# Patient Record
Sex: Female | Born: 2000 | Race: Black or African American | Hispanic: No | Marital: Single | Smoking: Never smoker
Health system: Southern US, Community
[De-identification: ages and names within clinical notes are randomized; demographics above are authoritative.]

## PROBLEM LIST (undated history)

## (undated) DIAGNOSIS — F32A Depression, unspecified: Secondary | ICD-10-CM

## (undated) DIAGNOSIS — R112 Nausea with vomiting, unspecified: Secondary | ICD-10-CM

## (undated) DIAGNOSIS — J45909 Unspecified asthma, uncomplicated: Secondary | ICD-10-CM

## (undated) DIAGNOSIS — T8859XA Other complications of anesthesia, initial encounter: Secondary | ICD-10-CM

## (undated) DIAGNOSIS — F419 Anxiety disorder, unspecified: Secondary | ICD-10-CM

## (undated) DIAGNOSIS — D649 Anemia, unspecified: Secondary | ICD-10-CM

## (undated) DIAGNOSIS — F411 Generalized anxiety disorder: Secondary | ICD-10-CM

## (undated) DIAGNOSIS — F909 Attention-deficit hyperactivity disorder, unspecified type: Secondary | ICD-10-CM

## (undated) DIAGNOSIS — G43909 Migraine, unspecified, not intractable, without status migrainosus: Secondary | ICD-10-CM

## (undated) DIAGNOSIS — Z9889 Other specified postprocedural states: Secondary | ICD-10-CM

## (undated) DIAGNOSIS — F329 Major depressive disorder, single episode, unspecified: Secondary | ICD-10-CM

## (undated) HISTORY — PX: WISDOM TOOTH EXTRACTION: SHX21

## (undated) HISTORY — PX: NO PAST SURGERIES: SHX2092

## (undated) HISTORY — DX: Anemia, unspecified: D64.9

## (undated) HISTORY — PX: TONSILLECTOMY: SUR1361

---
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
## 2022-01-03 DIAGNOSIS — Z3493 Encounter for supervision of normal pregnancy, unspecified, third trimester: Secondary | ICD-10-CM | POA: Diagnosis not present

## 2022-01-03 DIAGNOSIS — Z419 Encounter for procedure for purposes other than remedying health state, unspecified: Secondary | ICD-10-CM | POA: Diagnosis not present

## 2022-01-03 DIAGNOSIS — Z331 Pregnant state, incidental: Secondary | ICD-10-CM | POA: Diagnosis not present

## 2022-01-03 DIAGNOSIS — Z363 Encounter for antenatal screening for malformations: Secondary | ICD-10-CM | POA: Diagnosis not present

## 2022-01-03 DIAGNOSIS — Z3A37 37 weeks gestation of pregnancy: Secondary | ICD-10-CM | POA: Diagnosis not present

## 2022-01-03 DIAGNOSIS — Z3A30 30 weeks gestation of pregnancy: Secondary | ICD-10-CM | POA: Diagnosis not present

## 2022-01-03 DIAGNOSIS — Z3686 Encounter for antenatal screening for cervical length: Secondary | ICD-10-CM | POA: Diagnosis not present

## 2022-01-08 IMAGING — CT CT ANGIO HEAD
2 of 12 series · 6 of 33 positions shown · IV contrast (omnipaque)
Comparison: None.

CLINICAL DATA: Severe headache, left arm weakness and numbness

EXAM:
CT ANGIOGRAPHY HEAD AND NECK
TECHNIQUE: Multidetector CT imaging of the head and neck was performed using
the standard protocol during bolus administration of intravenous
contrast. Multiplanar CT image reconstructions and MIPs were
obtained to evaluate the vascular anatomy. Carotid stenosis
measurements (when applicable) are obtained utilizing NASCET
criteria, using the distal internal carotid diameter as the
denominator.
CONTRAST:  100mL OMNIPAQUE IOHEXOL 350 MG/ML SOLN

[Series 9: cta head & neck · axial · 0.60mm/px · z∈[-584,-387]mm · 4 of 662 slices shown]
[im 133/662  soft-tissue]
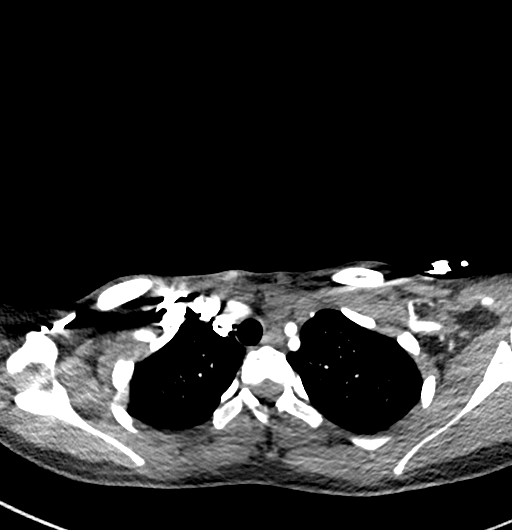
[im 265/662  bone]
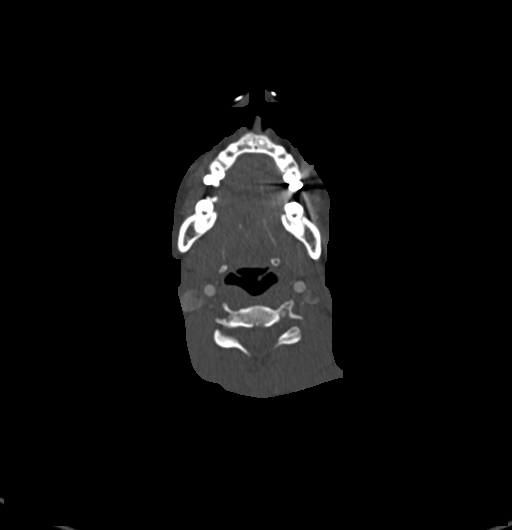
[im 397/662  soft-tissue]
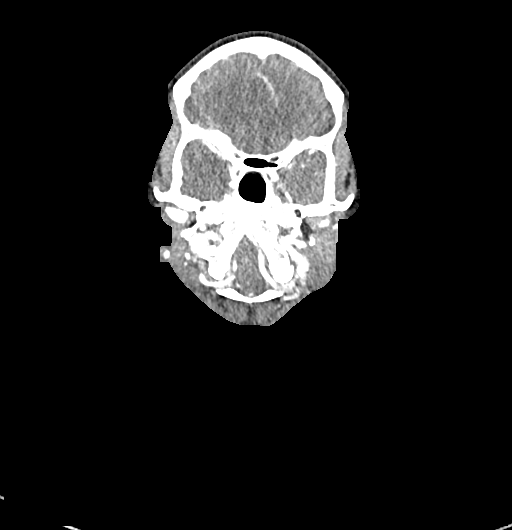
[im 529/662  bone]
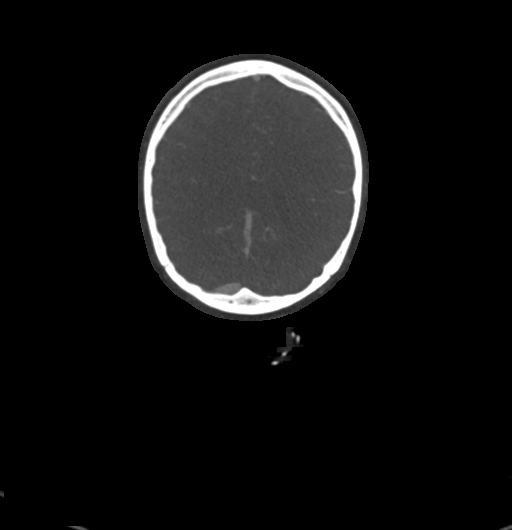

[Series 10: ax thin · axial · 0.60mm/px · z∈[-540,-430]mm · 2 of 331 slices shown]
[im 111/331  soft-tissue]
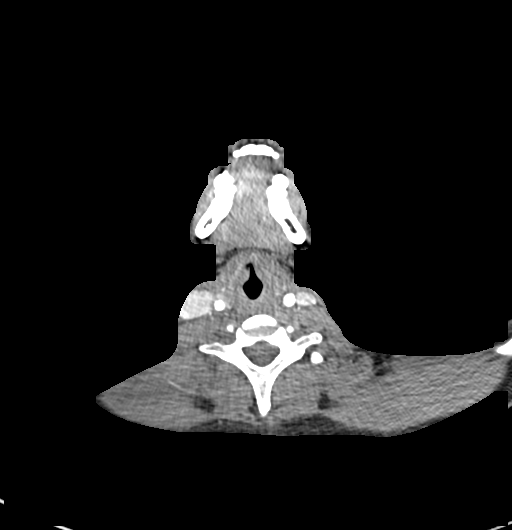
[im 221/331  soft-tissue]
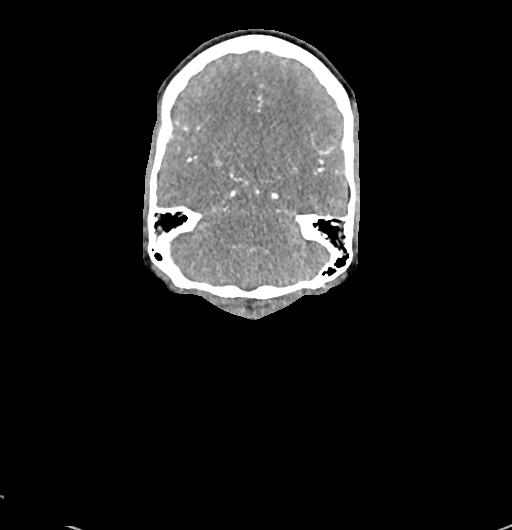

[6 of 33 positions shown; findings below may reference images not displayed]

FINDINGS: CT HEAD

Brain: There is no acute intracranial hemorrhage, mass effect, or
edema. Gray-white differentiation is preserved. There is no
extra-axial fluid collection. Ventricles and sulci are within normal
limits in size and configuration.

Vascular: No hyperdense vessel.  Better evaluated on CTA portion.

Skull: Calvarium is unremarkable.

Sinuses/Orbits: No acute finding.

Other: None.

Review of the MIP images confirms the above findings

CTA NECK

Aortic arch: Great vessel origins are patent.

Right carotid system: Patent. No stenosis, evidence of dissection,
or aneurysmal dilatation.

Left carotid system: Patent. No stenosis, evidence of dissection, or
aneurysmal dilatation.

Vertebral arteries: Patent. Left vertebral artery is slightly
dominant. No stenosis, evidence of dissection, or aneurysmal
dilatation.

Skeleton: No significant osseous abnormality.

Other neck: Unremarkable.

Upper chest: Included upper lungs are clear.

Review of the MIP images confirms the above findings

CTA HEAD

Anterior circulation: Intracranial internal carotid arteries are
patent. Anterior and middle cerebral arteries are patent.

Posterior circulation: Intracranial vertebral arteries, basilar
artery, and posterior cerebral arteries are patent. Bilateral
posterior communicating arteries are present.

Venous sinuses: Patent as allowed by contrast bolus timing.

Review of the MIP images confirms the above findings
IMPRESSION: No acute intracranial abnormality.

Unremarkable vascular imaging.

## 2022-01-15 ENCOUNTER — Ambulatory Visit (INDEPENDENT_AMBULATORY_CARE_PROVIDER_SITE_OTHER): Payer: Medicaid Other | Admitting: Clinical

## 2022-01-15 ENCOUNTER — Other Ambulatory Visit: Payer: Self-pay

## 2022-01-15 ENCOUNTER — Encounter (HOSPITAL_COMMUNITY): Payer: Self-pay | Admitting: Psychiatry

## 2022-01-15 DIAGNOSIS — F411 Generalized anxiety disorder: Secondary | ICD-10-CM

## 2022-01-15 DIAGNOSIS — F410 Panic disorder [episodic paroxysmal anxiety] without agoraphobia: Secondary | ICD-10-CM | POA: Diagnosis not present

## 2022-01-15 NOTE — Progress Notes (Unsigned)
° °  THERAPIST PROGRESS NOTE  Session Time: ***  Participation Level: {BHH PARTICIPATION LEVEL:22264}  Behavioral Response: {Appearance:22683}{BHH LEVEL OF CONSCIOUSNESS:22305}{BHH MOOD:22306}  Type of Therapy: {CHL AMB BH Type of Therapy:21022741}  Treatment Goals addressed: ***  ProgressTowards Goals: {Progress Towards Goals:21014066}  Interventions: {CHL AMB BH Type of Intervention:21022753}  Summary:  Brandi Church is a 21 y.o. female who presents for the scheduled session oriented times five, appropriately dressed, and friendly. Client denied hallucinations and delusions. Client reported on today she is doing good. Client reported since she was last seen school has been going well and her roommate situation has somewhat improved. Client reported she will be moving back home in three weeks. Client reported once she has her baby in  May she will finish school remotely. Client reported her checks ups with her OBGYN are good and the baby is doing well. Client reported her baby shower is being planned but feeling slightly disappointed about her mothers lack of communication. Client reported her anxiety and depression have caused very little interference but her main concern is after delivery. Client reported she thinks about her boundaries once the baby is here. Client reported she will have an attitude and people take it the wrong way when in reality she has learned if she does not speak up for herself she becomes uncomfortable. Client reported she is happy to have the support but wants to have room to figure things out on her own instead of others hovering over her. Client reported she has also thought about her mother as she becomes a parent. Client reported she does not want to treat her daughter how her mother did her. Client reported her mother left her to be raised by her grandparents at a young age. Evidence of progress towards goal:  Client reported she is able to cope with her day to day  activity using coping skills 5 out 7 days per week.   Suicidal/Homicidal: Nowithout intent/plan  Therapist Response:  Therapist began the appointment asking the client how she has been doing since last seen. Therapist used CBT to use active listening and positive emotional support. Therapist used CBT to ask the client if her coping skills interfere with her day to day functioning. Therapist used CBT to ask the client about childhood experiences that affect her current thought processing. Therapist used CBT ask the client to identify her progress with frequency of use with coping skills with continued practice in her daily activity.         Plan: Return again in 5 weeks.  Diagnosis: No diagnosis found.  Collaboration of Care: {BH OP Collaboration of Care:21014065}  Patient/Guardian was advised Release of Information must be obtained prior to any record release in order to collaborate their care with an outside provider. Patient/Guardian was advised if they have not already done so to contact the registration department to sign all necessary forms in order for [place] to release information regarding their care.   Consent: Patient/Guardian gives verbal consent for treatment and assignment of benefits for services provided during this visit. Patient/Guardian expressed understanding and agreed to proceed.   Brandi Church Brandi Hoiland, LCSW 01/15/2022

## 2022-01-18 ENCOUNTER — Telehealth (INDEPENDENT_AMBULATORY_CARE_PROVIDER_SITE_OTHER): Payer: Medicaid Other | Admitting: Psychiatry

## 2022-01-18 DIAGNOSIS — F3341 Major depressive disorder, recurrent, in partial remission: Secondary | ICD-10-CM | POA: Diagnosis not present

## 2022-01-18 DIAGNOSIS — F411 Generalized anxiety disorder: Secondary | ICD-10-CM

## 2022-01-18 DIAGNOSIS — F410 Panic disorder [episodic paroxysmal anxiety] without agoraphobia: Secondary | ICD-10-CM | POA: Diagnosis not present

## 2022-01-18 MED ORDER — SERTRALINE HCL 25 MG PO TABS: 25.0000 mg | ORAL_TABLET | Freq: Every day | ORAL | 1 refills | Status: DC

## 2022-01-18 NOTE — Progress Notes (Signed)
BH MD/PA/NP OP Progress Note ? ?01/18/2022 11:30 AM ?Enriqueta Shutter  ?MRN:  [id number] ? ?Virtual Visit via Video Note ? ?I connected with Hiromi Krone on 01/18/22 at 11:00 AM EDT by a video enabled telemedicine application and verified that I am speaking with the correct person using two identifiers. ? ?Location: ?Patient: home ?Provider: offsite ?  ?I discussed the limitations of evaluation and management by telemedicine and the availability of in person appointments. The patient expressed understanding and agreed to proceed. ?  ?I discussed the assessment and treatment plan with the patient. The patient was provided an opportunity to ask questions and all were answered. The patient agreed with the plan and demonstrated an understanding of the instructions. ?  ?The patient was advised to call back or seek an in-person evaluation if the symptoms worsen or if the condition fails to improve as anticipated. ? ?I provided 10 minutes of non-face-to-face time during this encounter. ? ? ?Mcneil Sober, NP  ? ?Chief Complaint: Medication management ? ?HPI: Yesennia Hirota is a 21 year old female presenting to [facility] behavioral health outpatient for follow-up psychiatric evaluation.  She has a history of generalized anxiety disorder with panic attacks, major depressive disorder, and ADHD.  Her symptoms were previously managed with Zoloft 25 mg daily and hydroxyzine 10 mg, prior to her pregnancy.  Patient reports that she is [redacted] weeks gestation at this time and prefers to restart Zoloft 25 mg daily.  Medication risk versus benefits discussed.  Patient reports that her panic attacks have worsened and are unprovoked.  She reports participating in individual therapy for symptom management which is helpful, but her symptoms are intolerable. ?Patient is alert and oriented x4, calm, pleasant and willing to engage.  She denies suicidal or homicidal ideations, paranoia, delusional thought, auditory or visual hallucinations. ? ?Visit  Diagnosis:  ?  ICD-10-CM   ?1. MDD (major depressive disorder), recurrent, in partial remission (HCC)  F33.41   ?  ?2. Generalized anxiety disorder with panic attacks  F41.1   ? F41.0   ?  ? ? ?Past Psychiatric History: ADHD, major depressive disorder, generalized anxiety disorder with panic attacks ? ?Past Medical History:  ?Past Medical History:  ?Diagnosis Date  ? Anxiety   ? Asthma   ? Contusion of right little finger without damage to nail 01/28/2017  ? Depression   ? Injury of finger of right hand 01/28/2017  ? Migraines   ?  ?Past Surgical History:  ?Procedure Laterality Date  ? NO PAST SURGERIES    ? ? ?Family Psychiatric History: None known ? ?Family History:  ?Family History  ?Problem Relation Age of Onset  ? Healthy Mother   ? Healthy Father   ? Hypertension Maternal Grandmother   ? Hypertension Maternal Grandfather   ? Diabetes Maternal Grandfather   ? ? ?Social History:  ?Social History  ? ?Socioeconomic History  ? Marital status: Single  ?  Spouse name: Not on file  ? Number of children: Not on file  ? Years of education: Not on file  ? Highest education level: Not on file  ?Occupational History  ? Not on file  ?Tobacco Use  ? Smoking status: Never  ?  Passive exposure: Never  ? Smokeless tobacco: Never  ?Vaping Use  ? Vaping Use: Never used  ?Substance and Sexual Activity  ? Alcohol use: Never  ? Drug use: Never  ? Sexual activity: Yes  ?Other Topics Concern  ? Not on file  ?Social History Narrative  ?  Not on file  ? ?Social Determinants of Health  ? ?Financial Resource Strain: Not on file  ?Food Insecurity: Not on file  ?Transportation Needs: Not on file  ?Physical Activity: Not on file  ?Stress: Not on file  ?Social Connections: Not on file  ? ? ?Allergies:  ?Allergies  ?Allergen Reactions  ? Latex   ?  itching  ? ? ?Metabolic Disorder Labs: ?No results found for: HGBA1C, MPG ?No results found for: PROLACTIN ?No results found for: CHOL, TRIG, HDL, CHOLHDL, VLDL, LDLCALC ?No results found for:  TSH ? ?Therapeutic Level Labs: ?No results found for: LITHIUM ?No results found for: VALPROATE ?No components found for:  CBMZ ? ?Current Medications: ?Current Outpatient Medications  ?Medication Sig Dispense Refill  ? sertraline (ZOLOFT) 25 MG tablet Take 1 tablet (25 mg total) by mouth daily. 30 tablet 1  ? albuterol (VENTOLIN HFA) 108 (90 Base) MCG/ACT inhaler Inhale 1-2 puffs into the lungs every 6 (six) hours as needed for wheezing or shortness of breath. (Patient not taking: Reported on 06/29/2021)    ? DICLEGIS 10-10 MG TBEC Take 2 tablets at bedtime and one in the morning and one in the afternoon as needed for nausea. (Patient not taking: Reported on 11/20/2021) 60 tablet 2  ? hydrOXYzine (ATARAX/VISTARIL) 10 MG tablet TAKE 1 TABLET BY MOUTH TWICE A DAY AS NEEDED FOR ANXIETY (Patient not taking: Reported on 09/12/2021) 60 tablet 3  ? ondansetron (ZOFRAN ODT) 4 MG disintegrating tablet Take 1-2 tablets (4-8 mg total) by mouth every 6 (six) hours as needed for nausea or vomiting. (Patient not taking: Reported on 11/20/2021) 30 tablet 1  ? Prenat-Fe Poly-Methfol-FA-DHA (VITAFOL ULTRA) 29-0.6-0.4-200 MG CAPS Take 1 capsule by mouth daily before breakfast. 90 capsule 4  ? pseudoephedrine (SUDAFED) 60 MG tablet Take 1 tablet (60 mg total) by mouth once as needed for congestion. 30 tablet 0  ? ?No current facility-administered medications for this visit.  ? ? ? ?Musculoskeletal: ?Strength & Muscle Tone: N/A virtual visit ?Gait & Station: N/A ?Patient leans: N/A ? ?Psychiatric Specialty Exam: ?Review of Systems  ?Psychiatric/Behavioral:  Positive for dysphoric mood. Negative for hallucinations, self-injury and suicidal ideas. The patient is nervous/anxious.   ?All other systems reviewed and are negative.  ?Last menstrual period 06/07/2021.There is no height or weight on file to calculate BMI.  ?General Appearance: Well Groomed  ?Eye Contact:  Good  ?Speech:  Clear and Coherent  ?Volume:  Normal  ?Mood:  Depressed   ?Affect:  Congruent  ?Thought Process:  Goal Directed  ?Orientation:  Full (Time, Place, and Person)  ?Thought Content: Logical   ?Suicidal Thoughts:  No  ?Homicidal Thoughts:  No  ?Memory: Good  ?Judgement: Good  ?Insight: Good  ?Psychomotor Activity: N/A  ?Concentration: Good  ?Recall: Good  ?Fund of Knowledge: Good  ?Language: Good  ?Akathisia: N/A  ?Handed: Right  ?AIMS (if indicated): Not done  ?Assets:  Communication Skills ?Desire for Improvement  ?ADL's:  Intact  ?Cognition: WNL  ?Sleep:  Good  ? ?Screenings: ?GAD-7   ? ?Flowsheet Row [facility]  ?Total GAD-7 Score 0 11 8 11 17   ? ?  ? ?PHQ2-9   ? ?Flowsheet Row Routine Prenatal from 12/20/2021 in [facility] Initial Prenatal from 08/17/2021 in [facility] Video Visit from 06[facility]30/2021 in [facility] Office Visit from 01/19/2021 in [facility] ED from 12/06/2020 in [facility]  ?PHQ-2 Total Score 0 0 0 4 5  ?PHQ-9 Total Score 2 1 4 13 16   ? ?  ? ?Flowsheet Row Admission (Discharged) from 12/17/2021 in [facility] IV Medication 420 from 11/30/2021 in [facility] from 07/24/2021 in [facility]  ?C-SSRS RISK CATEGORY No Risk No Risk No Risk  ? ?  ? ? ? ?Assessment and Plan: Vanessia Bokhari is a 21 year old female presenting to [facility] behavioral health outpatient for follow-up psychiatric evaluation.  She has a history of generalized anxiety disorder with panic attacks, major depressive disorder, and ADHD.   Her symptoms were previously managed with Zoloft 25 mg daily and hydroxyzine 10 mg, prior to her pregnancy.  Patient reports that she is [redacted] weeks gestation at this time and prefers to restart Zoloft 25 mg daily.

## 2022-01-19 ENCOUNTER — Ambulatory Visit: Payer: Medicaid Other

## 2022-01-19 ENCOUNTER — Ambulatory Visit: Payer: Medicaid Other | Attending: Obstetrics and Gynecology

## 2022-01-30 ENCOUNTER — Ambulatory Visit (INDEPENDENT_AMBULATORY_CARE_PROVIDER_SITE_OTHER): Payer: Medicaid Other | Admitting: Clinical

## 2022-01-30 ENCOUNTER — Other Ambulatory Visit: Payer: Self-pay

## 2022-01-30 DIAGNOSIS — F411 Generalized anxiety disorder: Secondary | ICD-10-CM | POA: Diagnosis not present

## 2022-01-30 DIAGNOSIS — F410 Panic disorder [episodic paroxysmal anxiety] without agoraphobia: Secondary | ICD-10-CM

## 2022-01-31 ENCOUNTER — Encounter (HOSPITAL_COMMUNITY): Payer: Self-pay

## 2022-01-31 NOTE — Plan of Care (Signed)
Client was in agreement with the plan. °

## 2022-01-31 NOTE — Progress Notes (Signed)
?  THERAPIST PROGRESS NOTE ? ? ?Session Time: 30 minutes ? ?Participation Level: Active ? ?Behavioral Response: CasualAlertEuthymic ? ?Type of Therapy: Individual Therapy ? ?Treatment Goals addressed: patient will practice problem solving skills 3 times per week for the next 4 weeks ? ?ProgressTowards Goals: Progressing ? ?Interventions: CBT and Supportive ? ?Summary:  ?Brandi Church is a 21 y.o. female who presents for the scheduled session oriented x5, appropriately dressed, and friendly.  Client denied hallucinations and delusions. ?Client reported on today that she is doing pretty well.  Client reported she has been working on finishing up her school semester and continuing to make preparations for her baby.  Client reported she will be moving back home in a few weeks.  Client reported she got her midterm grades back and they were good.  Client reported the family is now planning her baby shower.  Client reported her depressive symptoms have been pretty fair but she has had persistent anxiety symptoms.  Client reported she is constantly thinking about worst-case scenario, on edge about preparing for the baby, and making sure that she remembers to get all her assignments done for school.  Client reported being on campus she feels sad sometimes about close that reveal her stomach which is showing more. Client reported she thinks people will look at her and judge her for being a mom on campus. Client reported otherwise her family and social support is really good.  ?Evidence of progress towards goal:  client reported she has been staying organized to help control things she can by staying organized using a planner 7 out of 7 days per week.  ? ? ?Suicidal/Homicidal: Nowithout intent/plan ? ?Therapist Response:  ?Therapist began the appointment asking the client how she has been doing since last seen. ?Therapist used CBT to engage using active listening and positive emotional support. ?Therapist used CBT to ask the  client about the severity of her anxiety symptoms and contributing external factors. ?Therapist used CBT to engage and normalize the clients emotional reaction and how to challenge negative thoughts. 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 ?Therapist used CBT ask the client to identify her progress with frequency of use with coping skills with continued practice in her daily activity.    ?Therapist assigned the client homework for her to read the psychoeducational worksheet provided to her about tips on managing stress and depression. ?Client was scheduled for next appointment. ? ? ? ?Plan: Return again in 5 weeks. ? ?Diagnosis: generalized anxiety disorder ? ?Collaboration of Care: Patient refused AEB none requested at this time. ? ?Patient/Guardian was advised Release of Information must be obtained prior to any record release in order to collaborate their care with an outside provider. Patient/Guardian was advised if they have not already done so to contact the registration department to sign all necessary forms in order for [place] to release information regarding their care.  ? ?Consent: Patient/Guardian gives verbal consent for treatment and assignment of benefits for services provided during this visit. Patient/Guardian expressed understanding and agreed to proceed.  ? ?Brandi Rhymes Joyceline Maiorino, LCSW ?01/31/2022 ? ?

## 2022-02-03 DIAGNOSIS — Z419 Encounter for procedure for purposes other than remedying health state, unspecified: Secondary | ICD-10-CM | POA: Diagnosis not present

## 2022-02-13 ENCOUNTER — Ambulatory Visit (HOSPITAL_COMMUNITY): Payer: Medicaid Other | Admitting: Clinical

## 2022-02-15 DIAGNOSIS — O09619 Supervision of young primigravida, unspecified trimester: Secondary | ICD-10-CM | POA: Diagnosis not present

## 2022-02-15 DIAGNOSIS — Z113 Encounter for screening for infections with a predominantly sexual mode of transmission: Secondary | ICD-10-CM | POA: Diagnosis not present

## 2022-02-15 DIAGNOSIS — Z369 Encounter for antenatal screening, unspecified: Secondary | ICD-10-CM | POA: Diagnosis not present

## 2022-02-15 DIAGNOSIS — D649 Anemia, unspecified: Secondary | ICD-10-CM | POA: Diagnosis not present

## 2022-02-15 DIAGNOSIS — J45909 Unspecified asthma, uncomplicated: Secondary | ICD-10-CM | POA: Diagnosis not present

## 2022-02-15 LAB — OB RESULTS CONSOLE GBS: GBS: NEGATIVE

## 2022-02-26 ENCOUNTER — Encounter (HOSPITAL_COMMUNITY): Payer: Self-pay | Admitting: Obstetrics & Gynecology

## 2022-02-26 ENCOUNTER — Other Ambulatory Visit: Payer: Self-pay

## 2022-02-26 ENCOUNTER — Inpatient Hospital Stay (HOSPITAL_COMMUNITY)
Admission: AD | Admit: 2022-02-26 | Discharge: 2022-02-26 | Disposition: A | Discharge status: Home / Self Care | Payer: Medicaid Other | Attending: Obstetrics & Gynecology | Admitting: Obstetrics & Gynecology

## 2022-02-26 DIAGNOSIS — Z3A37 37 weeks gestation of pregnancy: Secondary | ICD-10-CM

## 2022-02-26 DIAGNOSIS — O368130 Decreased fetal movements, third trimester, not applicable or unspecified: Secondary | ICD-10-CM

## 2022-02-26 DIAGNOSIS — O479 False labor, unspecified: Secondary | ICD-10-CM | POA: Diagnosis not present

## 2022-02-26 DIAGNOSIS — O471 False labor at or after 37 completed weeks of gestation: Secondary | ICD-10-CM | POA: Diagnosis not present

## 2022-02-26 DIAGNOSIS — Z3689 Encounter for other specified antenatal screening: Secondary | ICD-10-CM

## 2022-02-26 NOTE — MAU Note (Signed)
.  Brandi Church is a 21 y.o. at [redacted]w[redacted]d here in MAU reporting: contractions that started 2-3 hours ago and have progressed in intensity and decreased fetal movement over the last 1.5 hours. Denies SROM, vaginal bleeding or bloody show. ?Pain score: 6 lower abdomen ? ?Vitals:  ? 02/26/22 0602  ?BP: 119/73  ?Resp: 17  ?Temp: 98.4 ?F (36.9 ?C)  ?SpO2: 100%  ?   ?FHT:157bpm ? ?  ?

## 2022-02-26 NOTE — MAU Provider Note (Signed)
?History  ?  ? ?[id number] ? ?Arrival date and time: 02/26/22 0535 ?  ? ?Chief Complaint  ?Patient presents with  ? Contractions  ? Decreased Fetal Movement  ? ? ? ?HPI ?Brandi Church is a 21 y.o. at [redacted]w[redacted]d by LMP who presents for contractions & decreased fetal movement. ?Reports contractions started early this morning. States painful contractions that come about every 15 minutes. Denies vomiting, vaginal bleeding, or leaking of fluid.  ?Has only felt baby 2-3 times in the last 1.5 hours.   ? ?OB History   ? ? Gravida  ?1  ? Para  ?0  ? Term  ?0  ? Preterm  ?0  ? AB  ?0  ? Living  ?0  ?  ? ? SAB  ?0  ? IAB  ?0  ? Ectopic  ?0  ? Multiple  ?0  ? Live Births  ?0  ?   ?  ?  ? ? ?Past Medical History:  ?Diagnosis Date  ? Anxiety   ? Asthma   ? Contusion of right little finger without damage to nail 01/28/2017  ? Depression   ? Injury of finger of right hand 01/28/2017  ? Migraines   ? ? ?Past Surgical History:  ?Procedure Laterality Date  ? NO PAST SURGERIES    ? ? ?Family History  ?Problem Relation Age of Onset  ? Healthy Mother   ? Healthy Father   ? Hypertension Maternal Grandmother   ? Hypertension Maternal Grandfather   ? Diabetes Maternal Grandfather   ? ? ?Allergies  ?Allergen Reactions  ? Latex   ?  itching  ? ? ?No current facility-administered medications on file prior to encounter.  ? ?Current Outpatient Medications on File Prior to Encounter  ?Medication Sig Dispense Refill  ? fluticasone (FLOVENT HFA) 110 MCG/ACT inhaler Inhale 2 puffs into the lungs daily.    ? Prenat-Fe Poly-Methfol-FA-DHA (VITAFOL ULTRA) 29-0.6-0.4-200 MG CAPS Take 1 capsule by mouth daily before breakfast. 90 capsule 4  ? albuterol (VENTOLIN HFA) 108 (90 Base) MCG/ACT inhaler Inhale 1-2 puffs into the lungs every 6 (six) hours as needed for wheezing or shortness of breath. (Patient not taking: Reported on 06/29/2021)    ? DICLEGIS 10-10 MG TBEC Take 2 tablets at bedtime and one in the morning and one in the afternoon as needed for nausea.  (Patient not taking: Reported on 11/20/2021) 60 tablet 2  ? hydrOXYzine (ATARAX/VISTARIL) 10 MG tablet TAKE 1 TABLET BY MOUTH TWICE A DAY AS NEEDED FOR ANXIETY (Patient not taking: Reported on 09/12/2021) 60 tablet 3  ? ondansetron (ZOFRAN ODT) 4 MG disintegrating tablet Take 1-2 tablets (4-8 mg total) by mouth every 6 (six) hours as needed for nausea or vomiting. (Patient not taking: Reported on 11/20/2021) 30 tablet 1  ? pseudoephedrine (SUDAFED) 60 MG tablet Take 1 tablet (60 mg total) by mouth once as needed for congestion. 30 tablet 0  ? sertraline (ZOLOFT) 25 MG tablet Take 1 tablet (25 mg total) by mouth daily. 30 tablet 1  ? ? ? ?ROS ?Pertinent positives and negative per HPI, all others reviewed and negative ? ?Physical Exam  ? ?BP 119/74   Pulse (!) 103   Temp 98.4 ?F (36.9 ?C) (Oral)   Resp 17   Ht 5\' 4"  (1.626 m)   Wt 70.8 kg   LMP 06/07/2021   SpO2 99%   BMI 26.78 kg/m?  ? ?Patient Vitals for the past 24 hrs: ? BP Temp Temp src Pulse Resp  SpO2 Height Weight  ?02/26/22 0739 115/66 -- -- 92 -- -- -- --  ?02/26/22 0620 119/74 -- -- (!) 103 17 99 % -- --  ?02/26/22 0615 -- -- -- -- -- 100 % -- --  ?02/26/22 0610 -- -- -- -- -- 100 % -- --  ?02/26/22 0605 -- -- -- -- -- 100 % -- --  ?02/26/22 0603 119/73 -- -- 97 -- -- -- --  ?02/26/22 0602 119/73 98.4 ?F (36.9 ?C) Oral -- 17 100 % 5\' 4"  (1.626 m) 70.8 kg  ? ? ?Physical Exam ?Vitals and nursing note reviewed.  ?Constitutional:   ?   General: She is not in acute distress. ?   Appearance: Normal appearance.  ?HENT:  ?   Head: Normocephalic and atraumatic.  ?Eyes:  ?   Conjunctiva/sclera: Conjunctivae normal.  ?   Pupils: Pupils are equal, round, and reactive to light.  ?Pulmonary:  ?   Effort: Pulmonary effort is normal. No respiratory distress.  ?Neurological:  ?   General: No focal deficit present.  ?   Mental Status: She is alert.  ?Psychiatric:     ?   Mood and Affect: Mood normal.     ?   Behavior: Behavior normal.  ?  ? ?Cervical Exam ?Dilation:  Fingertip ?Effacement (%): 60 ?Cervical Position: Posterior ?Station: -3 ?Presentation: Vertex ?Exam by:: [email or web address] RN ? ?FHT ?Baseline 145, moderate variability, 15x15 accels, no decels ?Toco: irregular, Q2-10 minutes ?Cat: 1 ? ?Labs ?No results found for this or any previous visit (from the past 24 hour(s)). ? ?Imaging ?No results found. ? ?MAU Course  ?Procedures ?Lab Orders  ?No laboratory test(s) ordered today  ? ?No orders of the defined types were placed in this encounter. ? ?Imaging Orders  ?No imaging studies ordered today  ? ? ?MDM ?Cervix unchanged & irregular contractions on monitor ? ?Patient reports decreased movement for 1-2 hours prior to arrival. Just woke up & hasn't had anything to eat or drink. Movement improved in MAU. Reactive fetal tracing. Patient documented 13 movements while being monitored.  ?Assessment and Plan  ? ?1. False labor  ?-cervix unchanged & not having regular contractions  ?2. Decreased fetal movements in third trimester, single or unspecified fetus  ?-Reactive tracing & normal movement in MAU ?-discussed kick counts & reasons to return  ?3. NST (non-stress test) reactive   ?4. [redacted] weeks gestation of pregnancy   ? ? ? ?Ginnie Smart, NP ?02/26/22 ?7:39 AM ? ? ?

## 2022-02-26 NOTE — Progress Notes (Signed)
Attempted to check cervix.  RN unable to reach cervix.  Pt declines having someone else attempt VE. ?

## 2022-02-27 DIAGNOSIS — Z3A37 37 weeks gestation of pregnancy: Secondary | ICD-10-CM | POA: Diagnosis not present

## 2022-02-27 DIAGNOSIS — O43893 Other placental disorders, third trimester: Secondary | ICD-10-CM | POA: Diagnosis not present

## 2022-03-01 ENCOUNTER — Telehealth (HOSPITAL_COMMUNITY): Payer: Medicaid Other | Admitting: Psychiatry

## 2022-03-01 ENCOUNTER — Encounter (HOSPITAL_COMMUNITY): Payer: Self-pay

## 2022-03-05 DIAGNOSIS — Z419 Encounter for procedure for purposes other than remedying health state, unspecified: Secondary | ICD-10-CM | POA: Diagnosis not present

## 2022-03-06 ENCOUNTER — Inpatient Hospital Stay (HOSPITAL_BASED_OUTPATIENT_CLINIC_OR_DEPARTMENT_OTHER): Payer: Medicaid Other

## 2022-03-06 ENCOUNTER — Encounter (HOSPITAL_COMMUNITY): Payer: Self-pay | Admitting: Obstetrics & Gynecology

## 2022-03-06 ENCOUNTER — Inpatient Hospital Stay (HOSPITAL_COMMUNITY): Admit: 2022-03-06 | Payer: Self-pay

## 2022-03-06 ENCOUNTER — Inpatient Hospital Stay (HOSPITAL_COMMUNITY)
Admission: AD | Admit: 2022-03-06 | Discharge: 2022-03-08 | DRG: 806 | Disposition: A | Discharge status: Home / Self Care | Payer: Medicaid Other | Attending: Obstetrics and Gynecology | Admitting: Obstetrics and Gynecology

## 2022-03-06 ENCOUNTER — Other Ambulatory Visit: Payer: Self-pay

## 2022-03-06 DIAGNOSIS — F331 Major depressive disorder, recurrent, moderate: Secondary | ICD-10-CM | POA: Diagnosis present

## 2022-03-06 DIAGNOSIS — O99344 Other mental disorders complicating childbirth: Secondary | ICD-10-CM | POA: Diagnosis present

## 2022-03-06 DIAGNOSIS — O368130 Decreased fetal movements, third trimester, not applicable or unspecified: Secondary | ICD-10-CM

## 2022-03-06 DIAGNOSIS — O9952 Diseases of the respiratory system complicating childbirth: Secondary | ICD-10-CM | POA: Diagnosis present

## 2022-03-06 DIAGNOSIS — O99824 Streptococcus B carrier state complicating childbirth: Secondary | ICD-10-CM | POA: Diagnosis present

## 2022-03-06 DIAGNOSIS — F900 Attention-deficit hyperactivity disorder, predominantly inattentive type: Secondary | ICD-10-CM | POA: Diagnosis not present

## 2022-03-06 DIAGNOSIS — Z3A38 38 weeks gestation of pregnancy: Secondary | ICD-10-CM

## 2022-03-06 DIAGNOSIS — O9902 Anemia complicating childbirth: Secondary | ICD-10-CM | POA: Diagnosis not present

## 2022-03-06 DIAGNOSIS — F411 Generalized anxiety disorder: Secondary | ICD-10-CM | POA: Diagnosis not present

## 2022-03-06 DIAGNOSIS — Z3689 Encounter for other specified antenatal screening: Secondary | ICD-10-CM

## 2022-03-06 DIAGNOSIS — J4520 Mild intermittent asthma, uncomplicated: Secondary | ICD-10-CM | POA: Diagnosis not present

## 2022-03-06 DIAGNOSIS — D509 Iron deficiency anemia, unspecified: Secondary | ICD-10-CM | POA: Diagnosis present

## 2022-03-06 DIAGNOSIS — O99012 Anemia complicating pregnancy, second trimester: Secondary | ICD-10-CM

## 2022-03-06 DIAGNOSIS — O283 Abnormal ultrasonic finding on antenatal screening of mother: Secondary | ICD-10-CM

## 2022-03-06 DIAGNOSIS — Z3490 Encounter for supervision of normal pregnancy, unspecified, unspecified trimester: Secondary | ICD-10-CM

## 2022-03-06 LAB — CBC
HCT: 32.3 % — ABNORMAL LOW (ref 36.0–46.0)
Hemoglobin: 10 g/dL — ABNORMAL LOW (ref 12.0–15.0)
MCH: 25.8 pg — ABNORMAL LOW (ref 26.0–34.0)
MCHC: 31 g/dL (ref 30.0–36.0)
MCV: 83.2 fL (ref 80.0–100.0)
Platelets: 310 10*3/uL (ref 150–400)
RBC: 3.88 MIL/uL (ref 3.87–5.11)
RDW: 14.3 % (ref 11.5–15.5)
WBC: 8.9 10*3/uL (ref 4.0–10.5)
nRBC: 0 % (ref 0.0–0.2)

## 2022-03-06 LAB — TYPE AND SCREEN
ABO/RH(D): B POS
Antibody Screen: NEGATIVE

## 2022-03-06 MED ORDER — TERBUTALINE SULFATE 1 MG/ML IJ SOLN: 0.2500 mg | Freq: Once | INTRAMUSCULAR | Status: DC | PRN

## 2022-03-06 MED ORDER — OXYTOCIN-SODIUM CHLORIDE 30-0.9 UT/500ML-% IV SOLN
2.5000 [IU]/h | INTRAVENOUS | Status: DC
  Administered 2022-03-07: 2.5 [IU]/h via INTRAVENOUS
  Filled 2022-03-06: qty 500

## 2022-03-06 MED ORDER — LACTATED RINGERS IV SOLN: 500.0000 mL | INTRAVENOUS | Status: DC | PRN

## 2022-03-06 MED ORDER — LACTATED RINGERS IV SOLN
500.0000 mL | Freq: Once | INTRAVENOUS | Status: AC
  Administered 2022-03-07: 500 mL via INTRAVENOUS

## 2022-03-06 MED ORDER — SOD CITRATE-CITRIC ACID 500-334 MG/5ML PO SOLN: 30.0000 mL | ORAL | Status: DC | PRN

## 2022-03-06 MED ORDER — LIDOCAINE HCL (PF) 1 % IJ SOLN: 30.0000 mL | INTRAMUSCULAR | Status: DC | PRN

## 2022-03-06 MED ORDER — DIPHENHYDRAMINE HCL 50 MG/ML IJ SOLN: 12.5000 mg | INTRAMUSCULAR | Status: DC | PRN

## 2022-03-06 MED ORDER — ACETAMINOPHEN 325 MG PO TABS: 650.0000 mg | ORAL_TABLET | ORAL | Status: DC | PRN

## 2022-03-06 MED ORDER — ONDANSETRON HCL 4 MG/2ML IJ SOLN: 4.0000 mg | Freq: Four times a day (QID) | INTRAMUSCULAR | Status: DC | PRN

## 2022-03-06 MED ORDER — PHENYLEPHRINE 80 MCG/ML (10ML) SYRINGE FOR IV PUSH (FOR BLOOD PRESSURE SUPPORT): 80.0000 ug | PREFILLED_SYRINGE | INTRAVENOUS | Status: DC | PRN

## 2022-03-06 MED ORDER — MISOPROSTOL 25 MCG QUARTER TABLET
25.0000 ug | ORAL_TABLET | ORAL | Status: DC | PRN
  Administered 2022-03-06 – 2022-03-07 (×3): 25 ug via VAGINAL
  Filled 2022-03-06 (×3): qty 1

## 2022-03-06 MED ORDER — EPHEDRINE 5 MG/ML INJ: 10.0000 mg | INTRAVENOUS | Status: DC | PRN

## 2022-03-06 MED ORDER — OXYCODONE-ACETAMINOPHEN 5-325 MG PO TABS: 2.0000 | ORAL_TABLET | ORAL | Status: DC | PRN

## 2022-03-06 MED ORDER — LACTATED RINGERS IV SOLN: INTRAVENOUS | Status: DC

## 2022-03-06 MED ORDER — HYDROXYZINE HCL 50 MG PO TABS
50.0000 mg | ORAL_TABLET | Freq: Four times a day (QID) | ORAL | Status: DC | PRN
  Filled 2022-03-06: qty 1

## 2022-03-06 MED ORDER — OXYTOCIN BOLUS FROM INFUSION
333.0000 mL | Freq: Once | INTRAVENOUS | Status: AC
  Administered 2022-03-07: 333 mL via INTRAVENOUS

## 2022-03-06 MED ORDER — FENTANYL-BUPIVACAINE-NACL 0.5-0.125-0.9 MG/250ML-% EP SOLN
12.0000 mL/h | EPIDURAL | Status: DC | PRN
  Administered 2022-03-07: 12 mL/h via EPIDURAL
  Filled 2022-03-06: qty 250

## 2022-03-06 MED ORDER — OXYCODONE-ACETAMINOPHEN 5-325 MG PO TABS: 1.0000 | ORAL_TABLET | ORAL | Status: DC | PRN

## 2022-03-06 MED ORDER — FENTANYL CITRATE (PF) 100 MCG/2ML IJ SOLN
50.0000 ug | INTRAMUSCULAR | Status: DC | PRN
  Administered 2022-03-07: 100 ug via INTRAVENOUS
  Administered 2022-03-07: 50 ug via INTRAVENOUS
  Filled 2022-03-06 (×2): qty 2

## 2022-03-06 NOTE — H&P (Signed)
OB ADMISSION HISTORY & PHYSICAL ? ?Admission Date: 03/06/2022 11:15 AM  ?Admit Diagnosis: Decreased Fetal movement ? ?Brandi Church is a 21 y.o. female G1P0000 [redacted]w[redacted]d presented to MAU for decreased FM beginning this morning. Patient reports last night when she went to sleep she was feeling normal movement. Normal movement for her baby is moving a lot all throughout the day. Patient states she woke up this morning and felt no movement at all, until she felt one movement while she was waiting in MAU. Patient reports even after eating breakfast this morning there was no change in movement.   Denies regular ctx, no lof, no vaginal bleeding.  ? ?History of current pregnancy: ?G1P0000   ?Prenatal Care with: [facility] (transferred care at 30 weeks) ?Patient entered prenatal care at 10 wks.   ?EDC 03/14/22 by LMP8/3/22 and congruent w/ 19 wk U/S.   ?Anatomy scan:  19 wks, complete w/ anterior placenta.   ? ?Significant prenatal problems: ?Teen pregnancy ?Bilateral choroid plexus cysts - genetics normal ?Mixed Depression and Anxiety disorder ?ADHD ?Mild intermittent asthma ?Anemia of pregnancy ? ?Patient Active Problem List  ? Diagnosis Date Noted  ? Anemia affecting pregnancy in second trimester 12/09/2021  ? Abnormal prenatal ultrasound 10/18/2021  ? Supervision of normal first pregnancy 08/16/2021  ? MDD (major depressive disorder), recurrent episode, moderate (HCC) 01/19/2021  ? Attention deficit hyperactivity disorder (ADHD), predominantly inattentive type 01/19/2021  ? Generalized anxiety disorder with panic attacks 12/09/2020  ? Headache disorder 06/22/2020  ? Asthma 05/26/2019  ? ? ?Prenatal Labs: ?ABO, Rh: B/Positive/-- (10/13 1044) ?Antibody: Negative (10/13 1044) ?Rubella: 3.38 (10/13 1044) IMMUNE ?RPR: Non Reactive (02/15 0900)  ?HBsAg: Negative (10/13 1044)  ?HIV: Non Reactive (02/15 0900)  ?GTT: PASS ?GBS:   NEG ?GC/CHL:NEG  ?Genetics: LOW RISK ?Vaccines: ?Tdap: Y ?Flu Y ?Covid: Y ? ?Prenatal Transfer Tool  ?Maternal  Diabetes: No ?Genetic Screening: Normal ?Maternal Ultrasounds/Referrals: Isolated choroid plexus cyst ?Fetal Ultrasounds or other Referrals:  Referred to Materal Fetal Medicine  ?Maternal Substance Abuse:  No ?Significant Maternal Medications:  Meds include: Zoloft ?Significant Maternal Lab Results:  Group B Strep negative ?Other Comments:  None ? ?OB History  ?Gravida Para Term Preterm AB Living  ?1 0 0 0 0 0  ?SAB IAB Ectopic Multiple Live Births  ?0 0 0 0 0  ?  ?# Outcome Date GA Lbr Len/2nd Weight Sex Delivery Anes PTL Lv  ?1 Current           ? ? ?Medical / Surgical History: ?Past medical history:  ?Past Medical History:  ?Diagnosis Date  ? Anxiety   ? Asthma   ? Contusion of right little finger without damage to nail 01/28/2017  ? Depression   ? Injury of finger of right hand 01/28/2017  ? Migraines   ?  ?Past surgical history:  ?Past Surgical History:  ?Procedure Laterality Date  ? NO PAST SURGERIES    ? ?Family History:  ?Family History  ?Problem Relation Age of Onset  ? Healthy Mother   ? Healthy Father   ? Hypertension Maternal Grandmother   ? Hypertension Maternal Grandfather   ? Diabetes Maternal Grandfather   ?  ?Social History:  reports that she has never smoked. She has never been exposed to tobacco smoke. She has never used smokeless tobacco. She reports that she does not drink alcohol and does not use drugs. ? ?Allergies: ?Latex ?  ?Current Medications at time of admission:  ?Prior to Admission medications   ?Medication Sig  Start Date End Date Taking? Authorizing Provider  ?fluticasone (FLOVENT HFA) 110 MCG/ACT inhaler Inhale 2 puffs into the lungs daily.    [provider]  ?Prenat-Fe Poly-Methfol-FA-DHA (VITAFOL ULTRA) 29-0.6-0.4-200 MG CAPS Take 1 capsule by mouth daily before breakfast. 09/12/21   Brock Bad, MD  ?pseudoephedrine (SUDAFED) 60 MG tablet Take 1 tablet (60 mg total) by mouth once as needed for congestion. 12/17/21   Calvert Cantor, CNM  ?sertraline (ZOLOFT) 25  MG tablet Take 1 tablet (25 mg total) by mouth daily. 01/18/22 01/18/23  Mcneil Sober, NP  ? ? ?Review of Systems: ?Constitutional: Negative   ?HENT: Negative   ?Eyes: Negative   ?Respiratory: Negative   ?Cardiovascular: Negative   ?Gastrointestinal: Negative  ?Genitourinary: neg for bloody show, neg for LOF   ?Musculoskeletal: Negative   ?Skin: Negative   ?Neurological: Negative   ?Endo/Heme/Allergies: Negative   ?Psychiatric/Behavioral: Negative  ? ? ?Physical Exam: ?VS: Blood pressure 112/66, pulse (!) 104, temperature 98.9 ?F (37.2 ?C), temperature source Oral, resp. rate 18, height 5\' 4"  (1.626 m), weight 72 kg, last menstrual period 06/07/2021, SpO2 100 %. ?AAO x3, no signs of distress ?Cardiovascular: RRR ?Respiratory: Lung fields clear to ausculation ?GU/GI: Abdomen gravid, non-tender, non-distended, active FM,  ?Extremities: no edema, negative for pain, tenderness, and cords ? ?Cervical exam: (per RN) closed 50%/-3 ?FHR: baseline rate 135 / variability moderate / accelerations present / absent decelerations ?TOCO: irritability ? ?Recent Ultrasounds: ?BPP today:8 /8 ? ?06-07-2004 office for growth 02/27/22: ?Vertex EFW 3451 grams 7# 10 oz 72%  ? ? ? ?  ? ? ?Assessment: ?21 y.o. G1P0000 [redacted]w[redacted]d admitted for induction of labor  ? ?1st stage of labor ?FHR category 1 ?GBS neg ?Pain management plan: labor support ? ? ?Plan:  ?Admit to Labor and Delivery ?Routine admission orders ?Epidural on maternal demand ?Misoprostol for cervical ripening ?IV hydration ?Continuous monitoring ?Anticipate nsvd delivery ? ? ?[redacted]w[redacted]d MD ?03/06/2022 2:59 PM  ?

## 2022-03-06 NOTE — MAU Note (Signed)
...  Brandi Church is a 21 y.o. at [redacted]w[redacted]d here in MAU reporting: DFM since 0800 this morning. She reports she ate breakfast around 0900 and still did not feel any movement. Denies VB or LOF.  ? ?Pain score: Denies pain. ? ?FHT: 132 initial external  ? ? ?

## 2022-03-06 NOTE — Progress Notes (Signed)
Subjective:   ? ?CNM to bedside for introductions. Pt doing well, does not perceive contractions or cramping, and is planning an epidural.   ? ?Objective:   ? ?VS: BP 120/72   Pulse 95   Temp 98.1 ?F (36.7 ?C)   Resp 16   Ht 5\' 4"  (1.626 m)   Wt 72 kg   LMP 06/07/2021   SpO2 100%   BMI 27.26 kg/m?  ?FHR : baseline 140 / variability moderate / accelerations present / absent decelerations ?Toco: irregular contractions ?Membranes: intact ?Dilation: 1 ?Effacement (%): 50 ?Cervical Position: Posterior ?Station: -3 ?Presentation: Vertex ?Exam by:: [email or web address], RN ? ? ?Assessment/Plan:  ? ?21 y.o. G1P0000 [redacted]w[redacted]d ?IOL for decreased ? ?Labor:  Cytotec x2 ?Fetal Wellbeing:  Category I ?Pain Control:   planning epidural ?I/D:   GBS positive ?Anticipated MOD:  NSVD ? ?[redacted]w[redacted]d MSN, CNM ?03/06/2022 9:38 PM ? ?

## 2022-03-06 NOTE — MAU Provider Note (Signed)
?History  ?  ? ?CSN: [id number] ? ?Arrival date and time: 03/06/22 1115 ? ? Event Date/Time  ? First Provider Initiated Contact with Patient 03/06/22 1207   ?  ? ?Chief Complaint  ?Patient presents with  ? Decreased Fetal Movement  ? ?Ms. Brandi Church is a 21 y.o. G1P0000 at [redacted]w[redacted]d who presents to MAU for feeling decreased fetal movement beginning this morning. Patient reports last night when she went to sleep she was feeling normal movement. Normal movement for her baby is moving a lot all throughout the day. Patient states she woke up this morning and felt no movement at all, until she felt one movement while she was waiting in MAU. Patient reports even after eating breakfast this morning there was no change in movement. Patient reports anemia and asthma this pregnancy, but denies other issues this pregnancy. Patient states she has not taken any medications in the past 24 hours. Patient's grandmother present for entire visit. ? ?Pt denies prior instances of DFM. ? ?Pt denies VB, LOF, ctx, vaginal discharge/odor/itching. ? ? ?OB History   ? ? Gravida  ?1  ? Para  ?0  ? Term  ?0  ? Preterm  ?0  ? AB  ?0  ? Living  ?0  ?  ? ? SAB  ?0  ? IAB  ?0  ? Ectopic  ?0  ? Multiple  ?0  ? Live Births  ?0  ?   ?  ?  ? ? ?Past Medical History:  ?Diagnosis Date  ? Anxiety   ? Asthma   ? Contusion of right little finger without damage to nail 01/28/2017  ? Depression   ? Injury of finger of right hand 01/28/2017  ? Migraines   ? ? ?Past Surgical History:  ?Procedure Laterality Date  ? NO PAST SURGERIES    ? ? ?Family History  ?Problem Relation Age of Onset  ? Healthy Mother   ? Healthy Father   ? Hypertension Maternal Grandmother   ? Hypertension Maternal Grandfather   ? Diabetes Maternal Grandfather   ? ? ?Social History  ? ?Tobacco Use  ? Smoking status: Never  ?  Passive exposure: Never  ? Smokeless tobacco: Never  ?Vaping Use  ? Vaping Use: Never used  ?Substance Use Topics  ? Alcohol use: Never  ? Drug use: Never  ? ? ?Allergies:   ?Allergies  ?Allergen Reactions  ? Latex   ?  itching  ? ? ?Medications Prior to Admission  ?Medication Sig Dispense Refill Last Dose  ? fluticasone (FLOVENT HFA) 110 MCG/ACT inhaler Inhale 2 puffs into the lungs daily.     ? Prenat-Fe Poly-Methfol-FA-DHA (VITAFOL ULTRA) 29-0.6-0.4-200 MG CAPS Take 1 capsule by mouth daily before breakfast. 90 capsule 4   ? pseudoephedrine (SUDAFED) 60 MG tablet Take 1 tablet (60 mg total) by mouth once as needed for congestion. 30 tablet 0   ? sertraline (ZOLOFT) 25 MG tablet Take 1 tablet (25 mg total) by mouth daily. 30 tablet 1   ? ? ?Review of Systems  ?Constitutional:  Negative for chills, diaphoresis, fatigue and fever.  ?Eyes:  Negative for visual disturbance.  ?Respiratory:  Negative for shortness of breath.   ?Cardiovascular:  Negative for chest pain.  ?Gastrointestinal:  Negative for abdominal pain, constipation, diarrhea, nausea and vomiting.  ?Genitourinary:  Negative for dysuria, flank pain, frequency, pelvic pain, urgency, vaginal bleeding and vaginal discharge.  ?Neurological:  Negative for dizziness, weakness, light-headedness and headaches.  ? ?Physical Exam  ? ?  Blood pressure 114/68, pulse (!) 105, temperature 98.9 ?F (37.2 ?C), temperature source Oral, resp. rate 18, height 5\' 4"  (1.626 m), weight 72 kg, last menstrual period 06/07/2021, SpO2 99 %. ? ?Patient Vitals for the past 24 hrs: ? BP Temp Temp src Pulse Resp SpO2 Height Weight  ?03/06/22 1134 114/68 98.9 ?F (37.2 ?C) Oral (!) 105 18 99 % 5\' 4"  (1.626 m) 72 kg  ? ?Physical Exam ?Vitals and nursing note reviewed.  ?Constitutional:   ?   General: She is not in acute distress. ?   Appearance: Normal appearance. She is not ill-appearing, toxic-appearing or diaphoretic.  ?HENT:  ?   Head: Normocephalic and atraumatic.  ?Pulmonary:  ?   Effort: Pulmonary effort is normal.  ?Neurological:  ?   Mental Status: She is alert and oriented to person, place, and time.  ?Psychiatric:     ?   Mood and Affect: Mood  normal.     ?   Behavior: Behavior normal.     ?   Thought Content: Thought content normal.     ?   Judgment: Judgment normal.  ? ?No results found for this or any previous visit (from the past 24 hour(s)). ? ?No results found. ? ?MAU Course  ?Procedures ? ?MDM ?-DFM with no return of normal movement in MAU ?-EFM: reactive with late ?      -baseline: 150 ?      -variability: moderate ?      -accels: present, 15x15 ?      -decels: single late ?      -TOCO: few, irregular ctx, irritability ?-pt pressed clicker 11 times in 05/06/22 ?-consulted with Dr. , pt to be delivered, will get BPP to determine if patient must stay at this time for continuous monitoring, or if can return tonight based on [facility] discretion ?-BPP: 8/8 ?-called Dr. Macon Large to recommend admission, Dr. 10/8 agrees with plan ?-admit to L&D for induction ? ?Orders Placed This Encounter  ?Procedures  ? Mora Appl MFM FETAL BPP WO NON STRESS  ?  Standing Status:   Standing  ?  Number of Occurrences:   1  ?  Order Specific Question:   Symptom/Reason for Exam  ?  Answer:   Decreased fetal movement Mora Appl  ? ?No orders of the defined types were placed in this encounter. ? ?Assessment and Plan  ? ?1. Decreased fetal movements in third trimester, single or unspecified fetus   ?2. Abnormal prenatal ultrasound   ?3. Anemia affecting pregnancy in second trimester   ?4. [redacted] weeks gestation of pregnancy   ?5. NST (non-stress test) reactive   ? ?-admit to L&D for induction ? ?Brandi Church ?03/06/2022, 2:49 PM  ?

## 2022-03-07 ENCOUNTER — Inpatient Hospital Stay (HOSPITAL_COMMUNITY): Payer: Medicaid Other | Admitting: Anesthesiology

## 2022-03-07 ENCOUNTER — Encounter (HOSPITAL_COMMUNITY): Payer: Self-pay | Admitting: Obstetrics and Gynecology

## 2022-03-07 DIAGNOSIS — Z3A38 38 weeks gestation of pregnancy: Secondary | ICD-10-CM | POA: Diagnosis not present

## 2022-03-07 DIAGNOSIS — O368131 Decreased fetal movements, third trimester, fetus 1: Secondary | ICD-10-CM | POA: Diagnosis not present

## 2022-03-07 DIAGNOSIS — Z3A39 39 weeks gestation of pregnancy: Secondary | ICD-10-CM | POA: Diagnosis not present

## 2022-03-07 DIAGNOSIS — F418 Other specified anxiety disorders: Secondary | ICD-10-CM | POA: Diagnosis not present

## 2022-03-07 DIAGNOSIS — O99344 Other mental disorders complicating childbirth: Secondary | ICD-10-CM | POA: Diagnosis not present

## 2022-03-07 LAB — RPR: RPR Ser Ql: NONREACTIVE

## 2022-03-07 MED ORDER — PRENATAL MULTIVITAMIN CH
1.0000 | ORAL_TABLET | Freq: Every day | ORAL | Status: DC
  Administered 2022-03-07 – 2022-03-08 (×2): 1 via ORAL
  Filled 2022-03-07 (×2): qty 1

## 2022-03-07 MED ORDER — BENZOCAINE-MENTHOL 20-0.5 % EX AERO: 1.0000 "application " | INHALATION_SPRAY | CUTANEOUS | Status: DC | PRN

## 2022-03-07 MED ORDER — LIDOCAINE-EPINEPHRINE (PF) 2 %-1:200000 IJ SOLN
INTRAMUSCULAR | Status: DC | PRN
  Administered 2022-03-07: 5 mL via EPIDURAL

## 2022-03-07 MED ORDER — ACETAMINOPHEN 325 MG PO TABS: 650.0000 mg | ORAL_TABLET | ORAL | Status: DC | PRN

## 2022-03-07 MED ORDER — SERTRALINE HCL 25 MG PO TABS
25.0000 mg | ORAL_TABLET | Freq: Every day | ORAL | Status: DC
  Administered 2022-03-07 – 2022-03-08 (×2): 25 mg via ORAL
  Filled 2022-03-07 (×3): qty 1

## 2022-03-07 MED ORDER — COCONUT OIL OIL
1.0000 "application " | TOPICAL_OIL | Status: DC | PRN
  Administered 2022-03-08: 1 via TOPICAL

## 2022-03-07 MED ORDER — DIPHENHYDRAMINE HCL 25 MG PO CAPS: 25.0000 mg | ORAL_CAPSULE | Freq: Four times a day (QID) | ORAL | Status: DC | PRN

## 2022-03-07 MED ORDER — DIBUCAINE (PERIANAL) 1 % EX OINT: 1.0000 "application " | TOPICAL_OINTMENT | CUTANEOUS | Status: DC | PRN

## 2022-03-07 MED ORDER — ONDANSETRON HCL 4 MG/2ML IJ SOLN: 4.0000 mg | INTRAMUSCULAR | Status: DC | PRN

## 2022-03-07 MED ORDER — SIMETHICONE 80 MG PO CHEW: 80.0000 mg | CHEWABLE_TABLET | ORAL | Status: DC | PRN

## 2022-03-07 MED ORDER — OXYCODONE-ACETAMINOPHEN 5-325 MG PO TABS: 1.0000 | ORAL_TABLET | ORAL | Status: DC | PRN

## 2022-03-07 MED ORDER — IBUPROFEN 600 MG PO TABS
600.0000 mg | ORAL_TABLET | Freq: Four times a day (QID) | ORAL | Status: DC
  Administered 2022-03-07 – 2022-03-08 (×4): 600 mg via ORAL
  Filled 2022-03-07 (×5): qty 1

## 2022-03-07 MED ORDER — TETANUS-DIPHTH-ACELL PERTUSSIS 5-2.5-18.5 LF-MCG/0.5 IM SUSY: 0.5000 mL | PREFILLED_SYRINGE | Freq: Once | INTRAMUSCULAR | Status: DC

## 2022-03-07 MED ORDER — SENNOSIDES-DOCUSATE SODIUM 8.6-50 MG PO TABS
2.0000 | ORAL_TABLET | ORAL | Status: DC
  Administered 2022-03-07 – 2022-03-08 (×2): 2 via ORAL
  Filled 2022-03-07 (×2): qty 2

## 2022-03-07 MED ORDER — ONDANSETRON HCL 4 MG PO TABS: 4.0000 mg | ORAL_TABLET | ORAL | Status: DC | PRN

## 2022-03-07 MED ORDER — WITCH HAZEL-GLYCERIN EX PADS
1.0000 "application " | MEDICATED_PAD | Freq: Four times a day (QID) | CUTANEOUS | Status: DC
  Administered 2022-03-07 (×4): 1 via TOPICAL

## 2022-03-07 NOTE — Progress Notes (Signed)
Subjective:   ? ?Doing well, resting periodically. Discussed next steps in induction course. Pt agrees to [facility] balloon placement.  ? ?Objective:   ? ?VS: BP 109/64   Pulse 90   Temp 98.1 ?F (36.7 ?C) (Oral)   Resp 16   Ht 5\' 4"  (1.626 m)   Wt 72 kg   LMP 06/07/2021   SpO2 100%   BMI 27.26 kg/m?  ?FHR : baseline 135 / variability moderate / accelerations present / absent decelerations ?Toco: contractions every 1-4 minutes ?Membranes: Intact ?Dilation: 2 ?Effacement (%): 50 ?Cervical Position: Posterior ?Station: -2 ?Presentation: Vertex ?Exam by:: [email or web address], CNM ? ? ?Assessment/Plan:  ? ?21 y.o. G1P0000 [redacted]w[redacted]d ?IOL for decreased fetal movement ? ?Labor:  S/P Cytotec x3, Cook balloon filled w/ 80 mL uterine and 60 mL vaginal . Pt tolerated well. ?Fetal Wellbeing:  Category I ?Pain Control:  Labor support without medications ?I/D:   GBS neg ?Anticipated MOD:  NSVD ? ?[redacted]w[redacted]d MSN, CNM ?03/07/2022 2:44 AM ? ?

## 2022-03-07 NOTE — Anesthesia Procedure Notes (Signed)
Epidural ?Patient location during procedure: OB ?Start time: 03/07/2022 5:30 AM ?End time: 03/07/2022 5:40 AM ? ?Staffing ?Anesthesiologist: Elmer Picker, MD ?Performed: anesthesiologist  ? ?Preanesthetic Checklist ?Completed: patient identified, IV checked, risks and benefits discussed, monitors and equipment checked, pre-op evaluation and timeout performed ? ?Epidural ?Patient position: sitting ?Prep: DuraPrep and site prepped and draped ?Patient monitoring: continuous pulse ox, blood pressure, heart rate and cardiac monitor ?Approach: midline ?Location: L3-L4 ?Injection technique: LOR air ? ?Needle:  ?Needle type: Tuohy  ?Needle gauge: 17 G ?Needle length: 9 cm ?Needle insertion depth: 4 cm ?Catheter type: closed end flexible ?Catheter size: 19 Gauge ?Catheter at skin depth: 10 cm ?Test dose: negative ? ?Assessment ?Sensory level: T8 ?Events: blood not aspirated, injection not painful, no injection resistance, no paresthesia and negative IV test ? ?Additional Notes ?Patient identified. Risks/Benefits/Options discussed with patient including but not limited to bleeding, infection, nerve damage, paralysis, failed block, incomplete pain control, headache, blood pressure changes, nausea, vomiting, reactions to medication both or allergic, itching and postpartum back pain. Confirmed with bedside nurse the patient's most recent platelet count. Confirmed with patient that they are not currently taking any anticoagulation, have any bleeding history or any family history of bleeding disorders. Patient expressed understanding and wished to proceed. All questions were answered. Sterile technique was used throughout the entire procedure. Please see nursing notes for vital signs. Test dose was given through epidural catheter and negative prior to continuing to dose epidural or start infusion. Warning signs of high block given to the patient including shortness of breath, tingling/numbness in hands, complete motor block, or  any concerning symptoms with instructions to call for help. Patient was given instructions on fall risk and not to get out of bed. All questions and concerns addressed with instructions to call with any issues or inadequate analgesia.  Reason for block:procedure for pain ? ? ? ?

## 2022-03-07 NOTE — Anesthesia Preprocedure Evaluation (Signed)
Anesthesia Evaluation  ?Patient identified by MRN, date of birth, ID band ?Patient awake ? ? ? ?Reviewed: ?Allergy & Precautions, NPO status , Patient's Chart, lab work & pertinent test results ? ?Airway ?Mallampati: II ? ?TM Distance: >3 FB ?Neck ROM: Full ? ? ? Dental ?no notable dental hx. ? ?  ?Pulmonary ?asthma ,  ?  ?Pulmonary exam normal ?breath sounds clear to auscultation ? ? ? ? ? ? Cardiovascular ?negative cardio ROS ?Normal cardiovascular exam ?Rhythm:Regular Rate:Normal ? ? ?  ?Neuro/Psych ? Headaches, PSYCHIATRIC DISORDERS Anxiety Depression   ? GI/Hepatic ?negative GI ROS, Neg liver ROS,   ?Endo/Other  ?negative endocrine ROS ? Renal/GU ?negative Renal ROS  ?negative genitourinary ?  ?Musculoskeletal ?negative musculoskeletal ROS ?(+)  ? Abdominal ?  ?Peds ? Hematology ?negative hematology ROS ?(+)   ?Anesthesia Other Findings ?IOL for DFM ? Reproductive/Obstetrics ?(+) Pregnancy ? ?  ? ? ? ? ? ? ? ? ? ? ? ? ? ?  ?  ? ? ? ? ? ? ? ? ?Anesthesia Physical ?Anesthesia Plan ? ?ASA: 2 ? ?Anesthesia Plan: Epidural  ? ?Post-op Pain Management:   ? ?Induction:  ? ?PONV Risk Score and Plan: Treatment may vary due to age or medical condition ? ?Airway Management Planned: Natural Airway ? ?Additional Equipment:  ? ?Intra-op Plan:  ? ?Post-operative Plan:  ? ?Informed Consent: I have reviewed the patients History and Physical, chart, labs and discussed the procedure including the risks, benefits and alternatives for the proposed anesthesia with the patient or authorized representative who has indicated his/her understanding and acceptance.  ? ? ? ? ? ?Plan Discussed with: Anesthesiologist ? ?Anesthesia Plan Comments: (Patient identified. Risks, benefits, options discussed with patient including but not limited to bleeding, infection, nerve damage, paralysis, failed block, incomplete pain control, headache, blood pressure changes, nausea, vomiting, reactions to medication, itching,  and post partum back pain. Confirmed with bedside nurse the patient's most recent platelet count. Confirmed with the patient that they are not taking any anticoagulation, have any bleeding history or any family history of bleeding disorders. Patient expressed understanding and wishes to proceed. All questions were answered. )  ? ? ? ? ? ? ?Anesthesia Quick Evaluation ? ?

## 2022-03-07 NOTE — Lactation Note (Addendum)
This note was copied from a baby's chart. ?Lactation Consultation Note ? ?Patient Name: Brandi Church ?Today's Date: 03/07/2022 ?Reason for consult: 1st time breastfeeding;Term;Initial assessment ?Age:21 years ?P1, term female infant. ?Per mom, infant has not been latching on MBU, she recently gave infant 7 mls of formula prior to [facility] entering the room, infant recently finished feeding.  ?LC observed mom has some areola edema in nipples , LC gave breast shells to wear in bra to help evert nipple shaft out more to help with latch. ?Mom can pre-pump with hand pump prior to latching infant at the breast. ?LC discussed how to hand express with breast model and mom self expressed 3 mls of colostrum in bottle. ?Mom set up with DEBP, mom knows to pump every 3 hours for 15 minutes on initial setting. ?Mom shown how to use DEBP & how to disassemble, clean, & reassemble parts.  ?Mom made aware of O/P services, breastfeeding support groups, community resources, and our phone # for post-discharge questions.   ?Mom's plan: ?1-  Mom will pre-pump with hand pump prior to latching infant at the breast, BF on demand, by cues, 8 to 12+ or more times within 24 hours, skin to skin. ?2- Mom will wear breast shells in bra when not sleeping to help alleviate areola edema. ?3- Mom will continue to use DEBP every 3 hours for 15 minutes on initial setting and give infant back any EBM first before formula. ?4- Mom will continue to work towards latching infant at the breast , will call RN/ LC for assistance with latch at infant's next feeding. ?Maternal Data ?Has patient been taught Hand Expression?: Yes ?Does the patient have breastfeeding experience prior to this delivery?: No ? ?Feeding ?Mother's Current Feeding Choice: Breast Milk and Formula ? ?LATCH Score ?  ? ?  ? ?  ? ?  ? ?  ? ?  ? ? ?Lactation Tools Discussed/Used ?Tools: Pump;Shells ?Breast pump type: Double-Electric Breast Pump ?Pump Education: Setup, frequency, and cleaning;Milk  Storage ?Reason for Pumping: Mom requested DEBP, infant has not been latching well at the breast. ?Pumping frequency: Mom will pump every 3 hours for 15 minutes on inital setting. ? ?Interventions ?  ? ?Discharge ?  ? ?Consult Status ?Consult Status: Follow-up ?Date: 03/08/22 ?Follow-up type: In-patient ? ? ? ?Vicente Serene ?03/07/2022, 4:02 PM ? ? ? ?

## 2022-03-07 NOTE — Lactation Note (Signed)
This note was copied from a baby's chart. ?Lactation Consultation Note ? ?Patient Name: Brandi Church ?Today's Date: 03/07/2022 ?Reason for consult: L&D Initial assessment;Primapara;1st time breastfeeding;Term ?Age:19 hours ? ? ?Initial L&D Consult: ? ?Visited with family < 1 hour after birth ?Mother had baby latched; baby appeared to have a deep latch and mother denied pain with feeding.  Reassured parents that lactation services will be available on the M/B unit.  Allowed time for family bonding.  Father and grandmother present. ? ? ?Maternal Data ?  ? ?Feeding ?Mother's Current Feeding Choice: Breast Milk ? ?LATCH Score ?Latch: Repeated attempts needed to sustain latch, nipple held in mouth throughout feeding, stimulation needed to elicit sucking reflex. ? ?Audible Swallowing: None ? ?Type of Nipple:  (Not observed due to mother having baby latched prior to my arrival) ? ?Comfort (Breast/Nipple): Soft / non-tender ? ?Hold (Positioning): No assistance needed to correctly position infant at breast. ? ?  ? ? ?Lactation Tools Discussed/Used ?  ? ?Interventions ?Interventions: Skin to skin ? ?Discharge ?  ? ?Consult Status ?Consult Status: Follow-up from L&D ? ? ? ?Raequon Catanzaro R [name] ?03/07/2022, 7:45 AM ? ? ? ?

## 2022-03-07 NOTE — Lactation Note (Addendum)
This note was copied from a baby's chart. ?Lactation Consultation Note ? ?Patient Name: Brandi Church ?Today's Date: 03/07/2022 ?Reason for consult: Follow-up assessment;Mother's request ?Age:19 hours ?P1, term female infant. ?Mom has mostly been breastfeeding infant, she has  only given formula to infant twice since birth. ?Per mom , she is wearing breast shells. ?LC observed that areola edema is resolving and infant is latching well at the breast.  ?LC entered the room RN assisted mom with latching infant at the breast and infant BF for 20 minutes. ?Mom is attempting latch infant on both breast during a feeding. ?Mom will continue to BF infant on demand, by cues, 8 to 12+ or more times within 24 hours, skin to skin. ?Mom had 3 mls of colostrum on counter that she had expressed less than 1 hour ago, infant was given EBM by spoon. ?Mom understands that at 24 hours of life infant may start cluster feeding, this is normal feeding behavior. ?Mom was doing skin to skin with infant when [facility] left the room. ?Mom knows to call RN/LC for further latch assistance if needed. ?Maternal Data ?  ? ?Feeding ?Mother's Current Feeding Choice: Breast Milk ? ?LATCH Score ?Latch: Repeated attempts needed to sustain latch, nipple held in mouth throughout feeding, stimulation needed to elicit sucking reflex. ? ?Audible Swallowing: A few with stimulation ? ?Type of Nipple: Flat ? ?Comfort (Breast/Nipple): Soft / non-tender ? ?Hold (Positioning): Assistance needed to correctly position infant at breast and maintain latch. ? ?LATCH Score: 6 ? ? ?Lactation Tools Discussed/Used ?  ? ?Interventions ?Interventions: Skin to skin;Expressed milk;Hand express;Education ? ?Discharge ?  ? ?Consult Status ?Date: 03/08/22 ?Follow-up type: In-patient ? ? ? ?Danelle Earthly ?03/07/2022, 9:29 PM ? ? ? ?

## 2022-03-07 NOTE — Progress Notes (Signed)
Subjective:   ? ?Epidural in place less than 30 min. Reports discomfort on left side and vaginal pressure. Agrees to amniotomy.  ? ?Objective:   ? ?VS: BP 116/71   Pulse 95   Temp 98.6 ?F (37 ?C)   Resp 16   Ht 5\' 4"  (1.626 m)   Wt 72 kg   LMP 06/07/2021   SpO2 100%   BMI 27.26 kg/m?  ?FHR : baseline 130 / variability moderate / accelerations present / absent decelerations ?Toco: contractions every 2 minutes  ?Membranes: AROM, clear ?Dilation: 7 ?Effacement (%): 90 ?Cervical Position: Posterior ?Station: -1 ?Presentation: Vertex ?Exam by:: [email or web address], CNM ? ? ?Assessment/Plan:  ? ?21 y.o. G1P0000 [redacted]w[redacted]d ?IOL for decreased FM ? ?Labor:  Cytotec x3 doses, Cook balloon, AROM, now active labor ?Fetal Wellbeing:  Category I ?Pain Control:  Epidural ?I/D:   GBS neg ?Anticipated MOD:  NSVD ? ?[redacted]w[redacted]d MSN, CNM ?03/07/2022 19:06 AM ? ?

## 2022-03-07 NOTE — Anesthesia Postprocedure Evaluation (Signed)
Anesthesia Post Note ? ?Patient: Brandi Church ? ?Procedure(s) Performed: AN AD HOC LABOR EPIDURAL ? ?  ? ?Patient location during evaluation: Mother Baby ?Anesthesia Type: Epidural ?Level of consciousness: awake ?Pain management: satisfactory to patient ?Vital Signs Assessment: post-procedure vital signs reviewed and stable ?Respiratory status: spontaneous breathing ?Cardiovascular status: stable ?Anesthetic complications: no ? ? ?No notable events documented. ? ?Last Vitals:  ?Vitals:  ? 03/07/22 0911 03/07/22 1100  ?BP: 117/75 115/74  ?Pulse: 80 76  ?Resp: 17 18  ?Temp: 36.7 ?C 36.8 ?C  ?SpO2: 98% 100%  ?  ?Last Pain:  ?Vitals:  ? 03/07/22 1100  ?TempSrc: Oral  ?PainSc:   ? ?Pain Goal:   ? ?  ?  ?  ?  ?  ?  ?  ? ?[name] ? ? ? ? ?

## 2022-03-08 LAB — CBC
HCT: 27.1 % — ABNORMAL LOW (ref 36.0–46.0)
Hemoglobin: 8.8 g/dL — ABNORMAL LOW (ref 12.0–15.0)
MCH: 26.3 pg (ref 26.0–34.0)
MCHC: 32.5 g/dL (ref 30.0–36.0)
MCV: 80.9 fL (ref 80.0–100.0)
Platelets: 273 10*3/uL (ref 150–400)
RBC: 3.35 MIL/uL — ABNORMAL LOW (ref 3.87–5.11)
RDW: 14.2 % (ref 11.5–15.5)
WBC: 11.7 10*3/uL — ABNORMAL HIGH (ref 4.0–10.5)
nRBC: 0 % (ref 0.0–0.2)

## 2022-03-08 MED ORDER — IBUPROFEN 600 MG PO TABS: 600.0000 mg | ORAL_TABLET | Freq: Four times a day (QID) | ORAL | 0 refills | Status: DC

## 2022-03-08 MED ORDER — FERROUS SULFATE 325 (65 FE) MG PO TBEC: 325.0000 mg | DELAYED_RELEASE_TABLET | Freq: Every day | ORAL | 0 refills | Status: DC

## 2022-03-08 NOTE — Social Work (Signed)
[name] was referred for history of depression/anxiety. ? ?* Referral screened out by Clinical Social Worker because none of the following criteria appear to apply: ?~ History of anxiety/depression during this pregnancy, or of post-partum depression following prior delivery. ?~ Diagnosis of anxiety and/or depression within last 3 years ?OR ?[name]'s symptoms currently being treated with medication and/or therapy.Per chart review, [name] takes Zoloft and Hydroxyzine for anxiety and depression symptoms. [name] completed Edinburgh score 3.  ? ?Please contact the Clinical Social Worker if needs arise, by [facility] request, or if [name] scores greater than 9/yes to question 10 on Edinburgh Postpartum Depression Screen.  ? ?Brandi Church, MSW, LCSW ?[facility]  ?Clinical Social Worker  ?[phone] ?03/08/2022  8:37 AM  ?

## 2022-03-08 NOTE — Lactation Note (Signed)
This note was copied from a baby's chart. ?Lactation Consultation Note ? ?Patient Name: Brandi Church ?Today's Date: 03/08/2022 ?Reason for consult: Follow-up assessment;1st time breastfeeding ?Age:19 hours ? ?P1, Mother's nipples are sore.  Discussed deep latch and provided mother with coconut oil. ?Feed on demand with cues.  Goal 8-12+ times per day after first 24 hrs.  Place baby STS if not cueing. Reviewed engorgement care and monitoring voids/stools. ? ? ?Feeding ?Mother's Current Feeding Choice: Breast Milk and Formula ?Nipple Type: Slow - flow ? ?LATCH Score ?Latch: Grasps breast easily, tongue down, lips flanged, rhythmical sucking. ? ?Audible Swallowing: A few with stimulation ? ?Type of Nipple: Everted at rest and after stimulation ? ?Comfort (Breast/Nipple): Soft / non-tender ? ?Hold (Positioning): Assistance needed to correctly position infant at breast and maintain latch. ? ?LATCH Score: 8 ? ? ?Lactation Tools Discussed/Used ?  ? ?Interventions ?Interventions: Coconut oil ? ?Discharge ?Discharge Education: Engorgement and breast care;Warning signs for feeding baby ? ?Consult Status ?Consult Status: Complete ?Date: 03/08/22 ? ? ? ?[name] ?03/08/2022, 1:05 PM ? ? ? ?

## 2022-03-08 NOTE — Discharge Summary (Addendum)
Physician Discharge Summary  ?Patient ID: ?Brandi Church ?MRN: [id number] ?DOB/AGE: 12/01/2000 21 y.o. ? ?Admit date: 03/06/2022 ?Discharge date: 03/08/2022 ? ?Admission Diagnoses: ?Induction of labor for decreased fetal movement at 38 weeks 6 days EGA. ?Teenage pregnancy.  ?Mixed anxiety and depression disorder.  ?Anemia (iron deficiency) ? ?Discharge Diagnoses:  ?Same as admission diagnosis and  ?5. Spontaneous vaginal delivery.   ? ?Discharged Condition: good ? ?Hospital Course: 21 y/o G1 P0 at [redacted] weeks EGA who presented complaining of decreased fetal movement.  She was kept for induction of labor for this reason.  She received cytotec, balloon catheter, AROM and pitocin and delivered a viable female infant. She had social work consultation while admitted.  She did well postpartum and was deemed stable for early discharge.  ? ?Consults:  Social work.  ? ?Treatments: As above.  ? ?Discharge Exam: ?Blood pressure 122/70, pulse 78, temperature 98.7 ?F (37.1 ?C), temperature source Oral, resp. rate 18, height 5\' 4"  (1.626 m), weight 72 kg, last menstrual period 06/07/2021, SpO2 98 %, unknown if currently breastfeeding. ?General appearance: alert and cooperative ?Resp: clear to auscultation bilaterally ?Cardio: regular rate and rhythm, S1, S2 normal, no murmur, click, rub or gallop ?Pelvic: Peripad: Moderate appropriate lochia ?Extremities: extremities normal, atraumatic, no cyanosis or edema. ? ? ?  Latest Ref Rng & Units 03/08/2022  ?  4:29 AM 03/06/2022  ?  3:45 PM 12/20/2021  ?  9:00 AM  ?CBC  ?WBC 4.0 - 10.5 K/uL 11.7   8.9   6.6    ?Hemoglobin 12.0 - 15.0 g/dL 8.8   12/22/2021   63.8    ?Hematocrit 36.0 - 46.0 % 27.1   32.3   32.0    ?Platelets 150 - 400 K/uL 273   310   265    ?  ? ? ?Disposition: To home, she lives with her grandparents and father of the baby.  ? ?  ?Allergies as of 03/08/2022   ? ?   Reactions  ? Latex Itching, Swelling  ? Reports swelling of mouth and itching  ? ?  ? ?  ?Medication List  ?  ? ?STOP taking these  medications   ? ?ondansetron 4 MG disintegrating tablet ?Commonly known as: ZOFRAN-ODT ?  ? ?  ? ?TAKE these medications   ? ?fluticasone 110 MCG/ACT inhaler ?Commonly known as: FLOVENT HFA ?Inhale 2 puffs into the lungs daily as needed. ?  ?ibuprofen 600 MG tablet ?Commonly known as: ADVIL ?Take 1 tablet (600 mg total) by mouth every 6 (six) hours. ?  ?pseudoephedrine 60 MG tablet ?Commonly known as: SUDAFED ?Take 1 tablet (60 mg total) by mouth once as needed for congestion. ?  ?sertraline 25 MG tablet ?Commonly known as: Zoloft ?Take 1 tablet (25 mg total) by mouth daily. ?  ?Vitafol Ultra 29-0.6-0.4-200 MG Caps ?Take 1 capsule by mouth daily before breakfast. ?  ? ?  ? Ferrous sulfate: 1 tab Q daily ? ? ? Follow-up Information   ? ? [facility]. Schedule an appointment as soon as possible for a visit in 1 week(s).   ?Specialty: Obstetrics and Gynecology ?Why: For mood check. ?Contact information: ?[address] ?[address] ?[place] ?[phone] ? ?  ?  ? ? [facility] [phone] [facility]. Schedule an appointment as soon as possible for a visit in 6 week(s).   ?Specialty: Obstetrics and Gynecology ?Why: Postpartum check. ?Contact information: ?[address] ?[address] ?[place] ?[phone] ? ?  ?  ? ?  ?  ? ?  ? ?  Signed: ?Prescilla Sours, MD.  ?03/08/2022, 1:01 PM ? ? ?

## 2022-03-08 NOTE — Progress Notes (Signed)
PPD# 1 SVD w/ right labial laceration ?Information for the patient's newborn:  Brandi Church, Brandi Church [[id number]]  ?female   ?Baby Name Brandi Church ? ? ?S:   ?Reports feeling good, tailbone is sore ?Tolerating PO fluid and solids ?No nausea or vomiting ?Bleeding is light ?Pain controlled with  PO meds ?Up ad lib / ambulatory / voiding w/o difficulty ?Feeding: Bottle and Breast  ? ? ?O:   ?VS: BP 122/70 (BP Location: Right Arm)   Pulse 78   Temp 98.7 ?F (37.1 ?C) (Oral)   Resp 18   Ht 5\' 4"  (1.626 m)   Wt 72 kg   LMP 06/07/2021   SpO2 98%   Breastfeeding Unknown   BMI 27.26 kg/m?  ? ?LABS:  ?Recent Labs  ?  03/06/22 ?1545 03/08/22 ?0429  ?WBC 8.9 11.7*  ?HGB 10.0* 8.8*  ?PLT 310 273  ? ?Blood type: --/--/B POS (05/02 1521) ?Rubella: Nonimmune (02/15 0000)       ?             ?  ?I&O: Intake/Output   ?   05/03 0701 ?05/04 0700  ? Urine (mL/kg/hr) 500 (0.3)  ? Blood 25  ? Total Output 525  ? Net -525  ?    ?  ? ?Physical Exam: ?Alert and oriented X3 ?Lungs: Clear and unlabored ?Heart: regular rate and rhythm / no mumurs ?Abdomen: soft, non-tender, non-distended  ?Fundus: firm, non-tender, U-2 ?Perineum: intact, healing ?Lochia: appropriate ?Extremities: no edema, no calf pain, tenderness, or cords ?  ? ?A:  ?PPD # 1  ?Normal exam ?Teen pregnancy ? ?P:  ?Routine post partum orders ?SW consult pending ?Anticipate D/C on 03/09/22 ? ? ?05/09/22, MSN, CNM ?03/08/2022, 6:35 AM ? ?

## 2022-03-14 ENCOUNTER — Inpatient Hospital Stay (HOSPITAL_COMMUNITY): Admit: 2022-03-14 | Payer: Self-pay

## 2022-03-14 ENCOUNTER — Inpatient Hospital Stay (HOSPITAL_COMMUNITY): Admit: 2022-03-14 | Payer: Medicaid Other | Admitting: Obstetrics and Gynecology

## 2022-03-15 ENCOUNTER — Telehealth (HOSPITAL_COMMUNITY): Payer: Self-pay | Admitting: *Deleted

## 2022-03-15 NOTE — Telephone Encounter (Signed)
Mom reports feeling good. No concerns about herself at this time. EPDS=2 [facility] score=3) ?Mom reports baby is doing well. Feeding, peeing, and pooping without difficulty. Safe sleep reviewed. Mom reports no concerns about baby at present. ? ?Duffy Rhody, RN 03-15-2022 at 12:21pm ?

## 2022-03-17 ENCOUNTER — Encounter (HOSPITAL_COMMUNITY): Payer: Self-pay | Admitting: Obstetrics and Gynecology

## 2022-03-17 ENCOUNTER — Other Ambulatory Visit: Payer: Self-pay

## 2022-03-17 ENCOUNTER — Inpatient Hospital Stay (HOSPITAL_COMMUNITY)
Admission: AD | Admit: 2022-03-17 | Discharge: 2022-03-17 | Disposition: A | Discharge status: Home / Self Care | Payer: Medicaid Other | Attending: Obstetrics and Gynecology | Admitting: Obstetrics and Gynecology

## 2022-03-17 DIAGNOSIS — O99893 Other specified diseases and conditions complicating puerperium: Secondary | ICD-10-CM | POA: Insufficient documentation

## 2022-03-17 DIAGNOSIS — O9089 Other complications of the puerperium, not elsewhere classified: Secondary | ICD-10-CM | POA: Diagnosis not present

## 2022-03-17 DIAGNOSIS — N3001 Acute cystitis with hematuria: Secondary | ICD-10-CM | POA: Diagnosis not present

## 2022-03-17 DIAGNOSIS — R509 Fever, unspecified: Secondary | ICD-10-CM | POA: Diagnosis not present

## 2022-03-17 DIAGNOSIS — Z20822 Contact with and (suspected) exposure to covid-19: Secondary | ICD-10-CM | POA: Diagnosis not present

## 2022-03-17 DIAGNOSIS — R519 Headache, unspecified: Secondary | ICD-10-CM

## 2022-03-17 LAB — CBC WITH DIFFERENTIAL/PLATELET
Abs Immature Granulocytes: 0.04 10*3/uL (ref 0.00–0.07)
Basophils Absolute: 0 10*3/uL (ref 0.0–0.1)
Basophils Relative: 0 %
Eosinophils Absolute: 0 10*3/uL (ref 0.0–0.5)
Eosinophils Relative: 0 %
HCT: 33.3 % — ABNORMAL LOW (ref 36.0–46.0)
Hemoglobin: 10.1 g/dL — ABNORMAL LOW (ref 12.0–15.0)
Immature Granulocytes: 0 %
Lymphocytes Relative: 10 %
Lymphs Abs: 1 10*3/uL (ref 0.7–4.0)
MCH: 25.3 pg — ABNORMAL LOW (ref 26.0–34.0)
MCHC: 30.3 g/dL (ref 30.0–36.0)
MCV: 83.3 fL (ref 80.0–100.0)
Monocytes Absolute: 0.8 10*3/uL (ref 0.1–1.0)
Monocytes Relative: 8 %
Neutro Abs: 8.2 10*3/uL — ABNORMAL HIGH (ref 1.7–7.7)
Neutrophils Relative %: 82 %
Platelets: 305 10*3/uL (ref 150–400)
RBC: 4 MIL/uL (ref 3.87–5.11)
RDW: 14.5 % (ref 11.5–15.5)
WBC: 10.1 10*3/uL (ref 4.0–10.5)
nRBC: 0 % (ref 0.0–0.2)

## 2022-03-17 LAB — COMPREHENSIVE METABOLIC PANEL
ALT: 14 U/L (ref 0–44)
AST: 16 U/L (ref 15–41)
Albumin: 3 g/dL — ABNORMAL LOW (ref 3.5–5.0)
Alkaline Phosphatase: 102 U/L (ref 38–126)
Anion gap: 10 (ref 5–15)
BUN: 11 mg/dL (ref 6–20)
CO2: 19 mmol/L — ABNORMAL LOW (ref 22–32)
Calcium: 8.4 mg/dL — ABNORMAL LOW (ref 8.9–10.3)
Chloride: 107 mmol/L (ref 98–111)
Creatinine, Ser: 0.8 mg/dL (ref 0.44–1.00)
GFR, Estimated: >60 mL/min (ref >60)
Glucose, Bld: 94 mg/dL (ref 70–99)
Potassium: 3.5 mmol/L (ref 3.5–5.1)
Sodium: 136 mmol/L (ref 135–145)
Total Bilirubin: 0.5 mg/dL (ref 0.3–1.2)
Total Protein: 6.2 g/dL — ABNORMAL LOW (ref 6.5–8.1)

## 2022-03-17 LAB — URINALYSIS, ROUTINE W REFLEX MICROSCOPIC
Bilirubin Urine: NEGATIVE
Glucose, UA: NEGATIVE mg/dL
Ketones, ur: 5 mg/dL — AB
Nitrite: POSITIVE — AB
Protein, ur: 100 mg/dL — AB
Specific Gravity, Urine: 1.017 (ref 1.005–1.030)
WBC, UA: >50 WBC/hpf — ABNORMAL HIGH (ref 0–5)
pH: 5 (ref 5.0–8.0)

## 2022-03-17 LAB — RESP PANEL BY RT-PCR (FLU A&B, COVID) ARPGX2
Influenza A by PCR: NEGATIVE
Influenza B by PCR: NEGATIVE
SARS Coronavirus 2 by RT PCR: NEGATIVE

## 2022-03-17 MED ORDER — LACTATED RINGERS IV BOLUS: 1000.0000 mL | Freq: Once | INTRAVENOUS | Status: DC

## 2022-03-17 MED ORDER — ACETAMINOPHEN 500 MG PO TABS
1000.0000 mg | ORAL_TABLET | Freq: Once | ORAL | Status: AC
Start: 2022-03-17 — End: 2022-03-17
  Administered 2022-03-17: 1000 mg via ORAL
  Filled 2022-03-17: qty 2

## 2022-03-17 MED ORDER — CEFADROXIL 1 G PO TABS: 1.0000 g | ORAL_TABLET | Freq: Two times a day (BID) | ORAL | 0 refills | Status: AC

## 2022-03-17 MED ORDER — CARBAMIDE PEROXIDE 6.5 % OT SOLN
5.0000 [drp] | Freq: Once | OTIC | Status: AC
  Administered 2022-03-17: 5 [drp] via OTIC
  Filled 2022-03-17: qty 15

## 2022-03-17 MED ORDER — CEFADROXIL 1 G PO TABS: 1.0000 g | ORAL_TABLET | Freq: Two times a day (BID) | ORAL | 0 refills | Status: DC

## 2022-03-17 NOTE — MAU Provider Note (Signed)
?History  ?  ? ?CSN: [id number] ? ?Arrival date and time: 03/17/22 1549 ? ? Event Date/Time  ? First Provider Initiated Contact with Patient 03/17/22 1628   ?  ? ?Chief Complaint  ?Patient presents with  ? Headache  ? Chills  ? ?Ms. Brandi Church is a 21 y.o. G1P1001 at postpartum who presents to MAU for fever. Patient had an uncomplicated vaginal delivery on 03/07/2022 and was discharged home from the hospital on 03/08/2022. Patient states she has had a headache for the past 3 days and has taken ibuprofen for the headache. Patient states the ibuprofen helped her headache for about an hour, but then it returned. Patient states she has been diagnosed with a headache disorder (denies diagnosis of migraines) and reports this feels like her normal headaches, but they are coming more frequently and are more severe than usual. Patient states she also is usually able to resolve her headaches with ibuprofen, but states that this has not helped her like it usually does. ? ?Patient reports she went to take a nap today and woke up around 2PM with chills. She took her temperature at home and it was 99. She called her OB who advised her to come to MAU for evaluation. ? ?Patient denies any pain. Patient reports she has had issues with urination since delivery in that she feels like she has to urinate again immediately after urinating, but attributed it to her stitches and to using a water bottle after urination for cleaning. ? ?Patient's mother present for entire visit. ? ?Pt denies VB, vaginal discharge/odor/itching. ?Pt denies N/V, abdominal pain, constipation, diarrhea. ?Pt denies fatigue, sweating or changes in appetite. ?Pt denies SOB or chest pain. ?Pt denies dizziness, light-headedness, weakness. ? ? ?Allergies? Latex ?Current medications/supplements? Ibuprofen, sertraline, Colace, iron ?Prenatal care provider? [facility] ? ? ?OB History   ? ? Gravida  ?1  ? Para  ?1  ? Term  ?1  ? Preterm  ?0  ? AB  ?0  ? Living  ?1  ?  ? ? SAB   ?0  ? IAB  ?0  ? Ectopic  ?0  ? Multiple  ?0  ? Live Births  ?1  ?   ?  ?  ? ? ?Past Medical History:  ?Diagnosis Date  ? Anxiety   ? Last used rescue inhaler 03/05/22  ? Asthma   ? Contusion of right little finger without damage to nail 01/28/2017  ? Depression   ? Injury of finger of right hand 01/28/2017  ? Migraines   ? ? ?Past Surgical History:  ?Procedure Laterality Date  ? NO PAST SURGERIES    ? ? ?Family History  ?Problem Relation Age of Onset  ? Healthy Mother   ? Healthy Father   ? Hypertension Maternal Grandmother   ? Hypertension Maternal Grandfather   ? Diabetes Maternal Grandfather   ? ? ?Social History  ? ?Tobacco Use  ? Smoking status: Never  ?  Passive exposure: Never  ? Smokeless tobacco: Never  ?Vaping Use  ? Vaping Use: Never used  ?Substance Use Topics  ? Alcohol use: Never  ? Drug use: Never  ? ? ?Allergies:  ?Allergies  ?Allergen Reactions  ? Latex Itching and Swelling  ?  Reports swelling of mouth and itching  ? ? ?Medications Prior to Admission  ?Medication Sig Dispense Refill Last Dose  ? docusate sodium (COLACE) 100 MG capsule Take 100 mg by mouth 2 (two) times daily.   03/16/2022  ?  ferrous sulfate 325 (65 FE) MG EC tablet Take 1 tablet (325 mg total) by mouth daily with breakfast. If with constipation do use over counter stool softener such as colace. 42 tablet 0 03/17/2022  ? ibuprofen (ADVIL) 600 MG tablet Take 1 tablet (600 mg total) by mouth every 6 (six) hours. 30 tablet 0 03/17/2022  ? Prenat-Fe Poly-Methfol-FA-DHA (VITAFOL ULTRA) 29-0.6-0.4-200 MG CAPS Take 1 capsule by mouth daily before breakfast. 90 capsule 4 03/17/2022  ? sertraline (ZOLOFT) 25 MG tablet Take 1 tablet (25 mg total) by mouth daily. 30 tablet 1 03/17/2022  ? fluticasone (FLOVENT HFA) 110 MCG/ACT inhaler Inhale 2 puffs into the lungs daily as needed.   More than a month  ? pseudoephedrine (SUDAFED) 60 MG tablet Take 1 tablet (60 mg total) by mouth once as needed for congestion. (Patient not taking: Reported on  03/08/2022) 30 tablet 0   ? ? ?Review of Systems  ?Constitutional:  Positive for chills and fever. Negative for diaphoresis and fatigue.  ?Eyes:  Negative for visual disturbance.  ?Respiratory:  Negative for shortness of breath.   ?Cardiovascular:  Negative for chest pain.  ?Gastrointestinal:  Negative for abdominal pain, constipation, diarrhea, nausea and vomiting.  ?Genitourinary:  Negative for dysuria, flank pain, frequency, pelvic pain, urgency, vaginal bleeding and vaginal discharge.  ?Neurological:  Positive for headaches. Negative for dizziness, weakness and light-headedness.  ? ?Physical Exam  ? ?Blood pressure 121/76, pulse 78, temperature 99.3 ?F (37.4 ?C), resp. rate 18, SpO2 100 %, unknown if currently breastfeeding. ? ?Patient Vitals for the past 24 hrs: ? BP Temp Pulse Resp SpO2  ?03/17/22 1800 121/76 99.3 ?F (37.4 ?C) 78 -- --  ?03/17/22 1745 127/77 -- 77 -- --  ?03/17/22 1730 134/77 -- 76 -- --  ?03/17/22 1715 137/79 -- 80 -- --  ?03/17/22 1700 135/82 -- 84 -- --  ?03/17/22 1652 133/82 -- 87 -- --  ?03/17/22 1630 (!) 145/88 -- 85 -- --  ?03/17/22 1615 137/82 -- 90 -- --  ?03/17/22 1607 134/83 (!) 101.4 ?F (38.6 ?C) 96 18 100 %  ? ?Physical Exam ?Vitals and nursing note reviewed. Exam conducted with a chaperone present.  ?Constitutional:   ?   General: She is not in acute distress. ?   Appearance: Normal appearance. She is not ill-appearing, toxic-appearing or diaphoretic.  ?HENT:  ?   Head: Normocephalic and atraumatic.  ?   Right Ear: Tympanic membrane, ear canal and external ear normal.  ?   Left Ear: Ear canal and external ear normal. There is impacted cerumen.  ?   Ears:  ?   Comments: Difficult to visialize left TM d/t impacted cerumen ?   Mouth/Throat:  ?   Mouth: Mucous membranes are moist.  ?   Pharynx: Oropharynx is clear. No oropharyngeal exudate or posterior oropharyngeal erythema.  ?Eyes:  ?   Pupils: Pupils are equal, round, and reactive to light.  ?Pulmonary:  ?   Effort: Pulmonary effort  is normal.  ?   Breath sounds: Normal breath sounds.  ?Chest:  ?Breasts: ?   Right: Normal. No tenderness.  ?   Left: Normal. No tenderness.  ?Abdominal:  ?   General: Abdomen is flat. There is no distension.  ?   Palpations: Abdomen is soft. There is no mass.  ?   Tenderness: There is no abdominal tenderness. There is no right CVA tenderness, left CVA tenderness, guarding or rebound.  ?   Hernia: No hernia is present.  ?Skin: ?  General: Skin is warm and dry.  ?Neurological:  ?   Mental Status: She is alert and oriented to person, place, and time.  ?Psychiatric:     ?   Mood and Affect: Mood normal.     ?   Behavior: Behavior normal.     ?   Thought Content: Thought content normal.     ?   Judgment: Judgment normal.  ? ?Results for orders placed or performed during the hospital encounter of 03/17/22 (from the past 24 hour(s))  ?Urinalysis, Routine w reflex microscopic Urine, Clean Catch     Status: Abnormal  ? Collection Time: 03/17/22  4:13 PM  ?Result Value Ref Range  ? Color, Urine YELLOW YELLOW  ? APPearance CLOUDY (A) CLEAR  ? Specific Gravity, Urine 1.017 1.005 - 1.030  ? pH 5.0 5.0 - 8.0  ? Glucose, UA NEGATIVE NEGATIVE mg/dL  ? Hgb urine dipstick MODERATE (A) NEGATIVE  ? Bilirubin Urine NEGATIVE NEGATIVE  ? Ketones, ur 5 (A) NEGATIVE mg/dL  ? Protein, ur 100 (A) NEGATIVE mg/dL  ? Nitrite POSITIVE (A) NEGATIVE  ? Leukocytes,Ua LARGE (A) NEGATIVE  ? RBC / HPF 21-50 0 - 5 RBC/hpf  ? WBC, UA >50 (H) 0 - 5 WBC/hpf  ? Bacteria, UA RARE (A) NONE SEEN  ? Squamous Epithelial / LPF 0-5 0 - 5  ? WBC Clumps PRESENT   ? Mucus PRESENT   ? Non Squamous Epithelial 0-5 (A) NONE SEEN  ?Resp Panel by RT-PCR (Flu A&B, Covid) Nasopharyngeal Swab     Status: None  ? Collection Time: 03/17/22  4:34 PM  ? Specimen: Nasopharyngeal Swab; Nasopharyngeal(NP) swabs in vial transport medium  ?Result Value Ref Range  ? SARS Coronavirus 2 by RT PCR NEGATIVE NEGATIVE  ? Influenza A by PCR NEGATIVE NEGATIVE  ? Influenza B by PCR NEGATIVE  NEGATIVE  ?CBC with Differential/Platelet     Status: Abnormal  ? Collection Time: 03/17/22  4:45 PM  ?Result Value Ref Range  ? WBC 10.1 4.0 - 10.5 K/uL  ? RBC 4.00 3.87 - 5.11 MIL/uL  ? Hemoglobin 10.1 (L) 1

## 2022-03-17 NOTE — MAU Note (Addendum)
.  Alverta Mcgorty is a 21 y.o. at Unknown here in MAU reporting: pp vag delivery 02/05/22. Stared having a headache on her left frond part of her head about 3 days ago. Taking ibuprofen without much relief. Reports she started having chills this afternoon and had a temp of 99.0. denies any visual changes orr upper abd pain. Stated she has had migraines in the past and this feel like it could be one., was worried when she starte feeling the chills.  ? ?Onset of complaint: 3 days ?Pain score: 8/10/ ?Vitals:  ? 03/17/22 1607  ?BP: 134/83  ?Pulse: 96  ?Resp: 18  ?Temp: (!) 101.4 ?F (38.6 ?C)  ?SpO2: 100%  ?   ?FHT:n/a ?Lab orders placed from triage:  U/A ? ?

## 2022-03-19 LAB — CULTURE, OB URINE: Culture: >=100000 — AB

## 2022-03-30 DIAGNOSIS — F419 Anxiety disorder, unspecified: Secondary | ICD-10-CM | POA: Diagnosis not present

## 2022-03-30 DIAGNOSIS — F32A Depression, unspecified: Secondary | ICD-10-CM | POA: Diagnosis not present

## 2022-03-30 DIAGNOSIS — O09619 Supervision of young primigravida, unspecified trimester: Secondary | ICD-10-CM | POA: Diagnosis not present

## 2022-03-30 DIAGNOSIS — D649 Anemia, unspecified: Secondary | ICD-10-CM | POA: Diagnosis not present

## 2022-03-30 DIAGNOSIS — F909 Attention-deficit hyperactivity disorder, unspecified type: Secondary | ICD-10-CM | POA: Diagnosis not present

## 2022-04-05 DIAGNOSIS — Z419 Encounter for procedure for purposes other than remedying health state, unspecified: Secondary | ICD-10-CM | POA: Diagnosis not present

## 2022-04-16 DIAGNOSIS — F411 Generalized anxiety disorder: Secondary | ICD-10-CM | POA: Diagnosis not present

## 2022-04-16 DIAGNOSIS — Z308 Encounter for other contraceptive management: Secondary | ICD-10-CM | POA: Diagnosis not present

## 2022-04-16 DIAGNOSIS — Z113 Encounter for screening for infections with a predominantly sexual mode of transmission: Secondary | ICD-10-CM | POA: Diagnosis not present

## 2022-04-30 ENCOUNTER — Ambulatory Visit (INDEPENDENT_AMBULATORY_CARE_PROVIDER_SITE_OTHER): Payer: Medicaid Other | Admitting: Clinical

## 2022-04-30 DIAGNOSIS — F410 Panic disorder [episodic paroxysmal anxiety] without agoraphobia: Secondary | ICD-10-CM | POA: Diagnosis not present

## 2022-04-30 DIAGNOSIS — F411 Generalized anxiety disorder: Secondary | ICD-10-CM

## 2022-05-03 DIAGNOSIS — Z30430 Encounter for insertion of intrauterine contraceptive device: Secondary | ICD-10-CM | POA: Diagnosis not present

## 2022-05-05 DIAGNOSIS — Z419 Encounter for procedure for purposes other than remedying health state, unspecified: Secondary | ICD-10-CM | POA: Diagnosis not present

## 2022-05-05 NOTE — Progress Notes (Signed)
   THERAPIST PROGRESS NOTE  Session Time: 25 minutes  Participation Level: Active  Behavioral Response: CasualAlertAnxious  Type of Therapy: Individual Therapy  Treatment Goals addressed: Client will practice problem-solving skills 3 times per week for the next 4 weeks  ProgressTowards Goals: Not Progressing  Interventions: CBT and Supportive  Summary:  Brandi Church is a 22 y.o. female who presents for the scheduled session oriented times five, appropriately dressed, and friendly. Client denied hallucinations and delusions. Client reported today she is doing pretty well.  Client reported she is doing well since having the baby.  Client reported she is still having anxiety and some panic attacks but she is doing her best to adjust.  Client reported her family and friends have been very supportive of her.  Client reported her OB/GYN prescribed lorazepam to help with her anxiety but would like to have a review by the [facility] psychiatrist.  Client reported otherwise that she is still on track with staying in school.  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 Evidence of progress towards goal: Client reported she is medication compliant 7 out of 7 days a week.     Suicidal/Homicidal: Nowithout intent/plan  Therapist Response:  Therapist began the appointment asking the client how she has been doing since last seen. Therapist used CBT to engage using active listening and positive emotional support. Therapist used CBT to ask client to describe how she is adjusting to being a new mother. Therapist used CBT to ask client to describe her anxiety symptoms are persisting. Therapist used CBT to discuss utilizing self compassion and being flexible with expectations of herself while she is adjusting to her new mood changes. Therapist used CBT ask the client to identify her progress with frequency of use with coping skills with continued practice in her daily activity.     Therapist assigned client homework to practice self-care    Plan: Return again in 5 weeks.  Diagnosis: Generalized anxiety disorder with panic attacks  Collaboration of Care: Patient refused AEB none requested by the client at this time  Patient/Guardian was advised Release of Information must be obtained prior to any record release in order to collaborate their care with an outside provider. Patient/Guardian was advised if they have not already done so to contact the registration department to sign all necessary forms in order for [place] to release information regarding their care.   Consent: Patient/Guardian gives verbal consent for treatment and assignment of benefits for services provided during this visit. Patient/Guardian expressed understanding and agreed to proceed.   Neena Rhymes Marella Vanderpol, LCSW 05/05/2022

## 2022-05-10 ENCOUNTER — Ambulatory Visit (INDEPENDENT_AMBULATORY_CARE_PROVIDER_SITE_OTHER): Payer: Medicaid Other | Admitting: Psychiatry

## 2022-05-10 ENCOUNTER — Encounter (HOSPITAL_COMMUNITY): Payer: Self-pay | Admitting: Psychiatry

## 2022-05-10 DIAGNOSIS — F3341 Major depressive disorder, recurrent, in partial remission: Secondary | ICD-10-CM | POA: Diagnosis not present

## 2022-05-10 DIAGNOSIS — F410 Panic disorder [episodic paroxysmal anxiety] without agoraphobia: Secondary | ICD-10-CM | POA: Diagnosis not present

## 2022-05-10 DIAGNOSIS — F411 Generalized anxiety disorder: Secondary | ICD-10-CM | POA: Diagnosis not present

## 2022-05-10 MED ORDER — SERTRALINE HCL 50 MG PO TABS: 50.0000 mg | ORAL_TABLET | Freq: Every day | ORAL | 3 refills | Status: DC

## 2022-05-10 NOTE — Progress Notes (Signed)
BH MD/PA/NP OP Progress Note Virtual Visit via Telephone Note  I connected with Brandi Church on 05/10/22 at  9:00 AM EDT by telephone and verified that I am speaking with the correct person using two identifiers.  Location: Patient: home Provider: Clinic   I discussed the limitations, risks, security and privacy concerns of performing an evaluation and management service by telephone and the availability of in person appointments. I also discussed with the patient that there may be a patient responsible charge related to this service. The patient expressed understanding and agreed to proceed.   I provided 30 minutes of non-face-to-face time during this encounter.   05/10/2022 9:03 AM Brandi Church  MRN:  [id number]  Chief Complaint: "My OB/GYN increased my Zoloft"  HPI: 21 year old female seen today for follow up psychiatric evaluation. She is currently she is managed on Lorazepam 0.5 mg as needed (prescribed by her OB/GYN) and Zoloft 50 mg daily. She notes her medications are effective in managing her psychiatric conditions.   Today she is unable to logon virtually so her assessment was done over the phone.  During exam she was pleasant, cooperative, and engaged in conversation.  She informed Clinical research associate that recently her OB/GYN increased her Zoloft from 25 mg to 50 mg due to increased anxiety and depression.  She also notes that she was given an order of lorazepam to help manage anxiety attacks.  Patient notes that she only has a limited supply of Ativan and tries not to take it less its and emergency.  Patient reports that at times she worries about if she is doing enough for herself and her infant daughter.  Today provider conducted GAD-7 and patient scored a 16.  Provider also conducted PHQ-9 and patient scored an 11.  She endorses poor appetite and notes that she has lost 10 pounds.  She also informed writer that her sleep is poor but attributes it to having an infant.  She informed Clinical research associate that  she sleeps approximately 4 hours nightly.  Today she denies SI/HI/VAH, mania, or paranoia.   Patient informed Clinical research associate that she continues to study at [employer].  She reports that she gets support from her grandmother and watching her child during the day.  No medication changes made today.  Patient agreeable to continue medications as prescribed.  She will follow-up with outpatient counseling for therapy.  No other concerns at this time.    Visit Diagnosis:    ICD-10-CM   1. Generalized anxiety disorder with panic attacks  F41.1 sertraline (ZOLOFT) 50 MG tablet   F41.0     2. MDD (major depressive disorder), recurrent, in partial remission (HCC)  F33.41 sertraline (ZOLOFT) 50 MG tablet       Past Psychiatric History: ADHD, GAD, and depression  Past Medical History:  Past Medical History:  Diagnosis Date   Anxiety    Last used rescue inhaler 03/05/22   Asthma    Contusion of right little finger without damage to nail 01/28/2017   Depression    Injury of finger of right hand 01/28/2017   Migraines     Past Surgical History:  Procedure Laterality Date   NO PAST SURGERIES      Family Psychiatric History: denies  Family History:  Family History  Problem Relation Age of Onset   Healthy Mother    Healthy Father    Hypertension Maternal Grandmother    Hypertension Maternal Grandfather    Diabetes Maternal Grandfather     Social History:  Social History   Socioeconomic History   Marital status: Single    Spouse name: Not on file   Number of children: Not on file   Years of education: Not on file   Highest education level: Not on file  Occupational History   Not on file  Tobacco Use   Smoking status: Never    Passive exposure: Never   Smokeless tobacco: Never  Vaping Use   Vaping Use: Never used  Substance and Sexual Activity   Alcohol use: Never   Drug use: Never   Sexual activity: Yes  Other Topics Concern   Not on file  Social History Narrative    Not on file   Social Determinants of Health   Financial Resource Strain: Not on file  Food Insecurity: Not on file  Transportation Needs: Not on file  Physical Activity: Not on file  Stress: Not on file  Social Connections: Not on file    Allergies:  Allergies  Allergen Reactions   Latex Itching and Swelling    Reports swelling of mouth and itching    Metabolic Disorder Labs: No results found for: "HGBA1C", "MPG" No results found for: "PROLACTIN" No results found for: "CHOL", "TRIG", "HDL", "CHOLHDL", "VLDL", "LDLCALC" No results found for: "TSH"  Therapeutic Level Labs: No results found for: "LITHIUM" No results found for: "VALPROATE" No results found for: "CBMZ"  Current Medications: Current Outpatient Medications  Medication Sig Dispense Refill   docusate sodium (COLACE) 100 MG capsule Take 100 mg by mouth 2 (two) times daily.     ferrous sulfate 325 (65 FE) MG EC tablet Take 1 tablet (325 mg total) by mouth daily with breakfast. If with constipation do use over counter stool softener such as colace. 42 tablet 0   fluticasone (FLOVENT HFA) 110 MCG/ACT inhaler Inhale 2 puffs into the lungs daily as needed.     ibuprofen (ADVIL) 600 MG tablet Take 1 tablet (600 mg total) by mouth every 6 (six) hours. 30 tablet 0   Prenat-Fe Poly-Methfol-FA-DHA (VITAFOL ULTRA) 29-0.6-0.4-200 MG CAPS Take 1 capsule by mouth daily before breakfast. 90 capsule 4   pseudoephedrine (SUDAFED) 60 MG tablet Take 1 tablet (60 mg total) by mouth once as needed for congestion. (Patient not taking: Reported on 03/08/2022) 30 tablet 0   sertraline (ZOLOFT) 50 MG tablet Take 1 tablet (50 mg total) by mouth daily. 30 tablet 3   No current facility-administered medications for this visit.     Musculoskeletal: Strength & Muscle Tone:  Unable to assess due to telephone visit Gait & Station:  Unable to assess due to telephone visit Patient leans: N/A  Psychiatric Specialty Exam: Review of Systems   unknown if currently breastfeeding.There is no height or weight on file to calculate BMI.  General Appearance:  Unable to assess due to telephone visit  Eye Contact:   Unable to assess due to telephone visit  Speech:  Clear and Coherent and Normal Rate  Volume:  Normal  Mood:  Anxious  Affect:  Appropriate  Thought Process:  Coherent, Goal Directed, and Linear  Orientation:  Full (Time, Place, and Person)  Thought Content: WDL and Logical   Suicidal Thoughts:  No  Homicidal Thoughts:  No  Memory:  Immediate;   Good Recent;   Good Remote;   Good  Judgement:  Good  Insight:  Good  Psychomotor Activity:   Unable to assess due to telephone visit  Concentration:  Concentration: Good and Attention Span: Good  Recall:  Good  Fund of Knowledge: Good  Language: Good  Akathisia:   Unable to assess due to telephone visit  Handed:  Right  AIMS (if indicated): not done  Assets:  Communication Skills Desire for Improvement Financial Resources/Insurance Housing Physical Health Social Support  ADL's:  Intact  Cognition: WNL  Sleep:  Poor   Screenings: GAD-7    Flowsheet Row Clinical Support from 05/10/2022 in [facility] Routine Prenatal from 12/20/2021 in [facility] Video Visit from 06/30/2021 in [facility] Counselor from 03/07/2021 in [facility] Office Visit from 01/19/2021 in [facility]  Total GAD-7 Score 16 0 11 8 11       PHQ2-9    Flowsheet Row Clinical Support from 05/10/2022 in [facility] Counselor from 01/30/2022 in [facility] Routine Prenatal from 12/20/2021 in [facility] Initial Prenatal from 08/17/2021 in [facility] Video Visit from 06/30/2021 in [facility]  PHQ-2 Total Score 3 0 0 0 0  PHQ-9  Total Score 11 3 2 1 4       Flowsheet Row Admission (Discharged) from 03/06/2022 in [facility] Admission (Discharged) from 02/26/2022 in [facility]S Maternity Assessment Unit Admission (Discharged) from 12/17/2021 in [facility]S Maternity Assessment Unit  C-SSRS RISK CATEGORY No Risk No Risk No Risk        Assessment and Plan: Patient reports that at times she is anxious and depressed however notes that she is able to cope with it with her current medication regimen.  No medication changes made today.  Patient agreeable to continue medications as prescribed.  1. Generalized anxiety disorder with panic attacks  Continue- sertraline (ZOLOFT) 50 MG tablet; Take 1 tablet (50 mg total) by mouth daily.  Dispense: 30 tablet; Refill: 3  2. MDD (major depressive disorder), recurrent, in partial remission (HCC)  Continue- sertraline (ZOLOFT) 50 MG tablet; Take 1 tablet (50 mg total) by mouth daily.  Dispense: 30 tablet; Refill: 3    Follow up in 3 months Follow up with therapy   02/14/2022, NP 05/10/2022, 9:03 AM

## 2022-06-05 DIAGNOSIS — Z419 Encounter for procedure for purposes other than remedying health state, unspecified: Secondary | ICD-10-CM | POA: Diagnosis not present

## 2022-06-16 ENCOUNTER — Encounter (HOSPITAL_BASED_OUTPATIENT_CLINIC_OR_DEPARTMENT_OTHER): Payer: Self-pay | Admitting: Emergency Medicine

## 2022-06-16 ENCOUNTER — Other Ambulatory Visit: Payer: Self-pay

## 2022-06-16 ENCOUNTER — Emergency Department (HOSPITAL_BASED_OUTPATIENT_CLINIC_OR_DEPARTMENT_OTHER)
Admission: EM | Admit: 2022-06-16 | Discharge: 2022-06-16 | Disposition: A | Discharge status: Home / Self Care | Payer: Medicaid Other | Attending: Emergency Medicine | Admitting: Emergency Medicine

## 2022-06-16 DIAGNOSIS — Z975 Presence of (intrauterine) contraceptive device: Secondary | ICD-10-CM | POA: Diagnosis not present

## 2022-06-16 DIAGNOSIS — J45909 Unspecified asthma, uncomplicated: Secondary | ICD-10-CM | POA: Diagnosis not present

## 2022-06-16 DIAGNOSIS — N939 Abnormal uterine and vaginal bleeding, unspecified: Secondary | ICD-10-CM | POA: Insufficient documentation

## 2022-06-16 DIAGNOSIS — R109 Unspecified abdominal pain: Secondary | ICD-10-CM | POA: Diagnosis present

## 2022-06-16 DIAGNOSIS — Z7951 Long term (current) use of inhaled steroids: Secondary | ICD-10-CM | POA: Insufficient documentation

## 2022-06-16 DIAGNOSIS — R1032 Left lower quadrant pain: Secondary | ICD-10-CM | POA: Diagnosis not present

## 2022-06-16 DIAGNOSIS — R1031 Right lower quadrant pain: Secondary | ICD-10-CM

## 2022-06-16 LAB — BASIC METABOLIC PANEL
Anion gap: 7 (ref 5–15)
BUN: 7 mg/dL (ref 6–20)
CO2: 24 mmol/L (ref 22–32)
Calcium: 9 mg/dL (ref 8.9–10.3)
Chloride: 107 mmol/L (ref 98–111)
Creatinine, Ser: 0.52 mg/dL (ref 0.44–1.00)
GFR, Estimated: >60 mL/min (ref >60)
Glucose, Bld: 91 mg/dL (ref 70–99)
Potassium: 3.8 mmol/L (ref 3.5–5.1)
Sodium: 138 mmol/L (ref 135–145)

## 2022-06-16 LAB — URINALYSIS, ROUTINE W REFLEX MICROSCOPIC
Bilirubin Urine: NEGATIVE
Glucose, UA: NEGATIVE mg/dL
Hgb urine dipstick: NEGATIVE
Ketones, ur: NEGATIVE mg/dL
Leukocytes,Ua: NEGATIVE
Nitrite: NEGATIVE
Protein, ur: NEGATIVE mg/dL
Specific Gravity, Urine: 1.015 (ref 1.005–1.030)
pH: 6 (ref 5.0–8.0)

## 2022-06-16 LAB — CBC
HCT: 38.2 % (ref 36.0–46.0)
Hemoglobin: 12.2 g/dL (ref 12.0–15.0)
MCH: 26.5 pg (ref 26.0–34.0)
MCHC: 31.9 g/dL (ref 30.0–36.0)
MCV: 82.9 fL (ref 80.0–100.0)
Platelets: 302 10*3/uL (ref 150–400)
RBC: 4.61 MIL/uL (ref 3.87–5.11)
RDW: 13.2 % (ref 11.5–15.5)
WBC: 5.1 10*3/uL (ref 4.0–10.5)
nRBC: 0 % (ref 0.0–0.2)

## 2022-06-16 LAB — WET PREP, GENITAL
Clue Cells Wet Prep HPF POC: NONE SEEN
Sperm: NONE SEEN
Trich, Wet Prep: NONE SEEN
WBC, Wet Prep HPF POC: <10 (ref <10)
Yeast Wet Prep HPF POC: NONE SEEN

## 2022-06-16 LAB — PREGNANCY, URINE: Preg Test, Ur: NEGATIVE

## 2022-06-16 MED ORDER — CEFTRIAXONE SODIUM 500 MG IJ SOLR
500.0000 mg | Freq: Once | INTRAMUSCULAR | Status: AC
  Administered 2022-06-16: 500 mg via INTRAMUSCULAR

## 2022-06-16 MED ORDER — STERILE WATER FOR INJECTION IJ SOLN
INTRAMUSCULAR | Status: AC
  Filled 2022-06-16: qty 10

## 2022-06-16 NOTE — ED Triage Notes (Signed)
Heavy bleeding and abdominal cramping. And odor Started a few days ago.  Changing pads about every hour. Had iud placed in June. Has had some bleeding since but now more.  Ibuprofen helped some

## 2022-06-16 NOTE — Discharge Instructions (Addendum)
You are seen the emergency department today for abdominal cramping and vaginal bleeding.  As we discussed I think the discharge you are having could be related to an STD versus change in your hormones, especially being postpartum and having an IUD.  We have given you the antibiotic for possible gonorrhea, please follow-up your results on MyChart.  If you do test positive for chlamydia, I recommend letting your OB/GYN know or going to the health department to get the necessary antibiotic.  In terms of the bleeding, your blood levels look normal today.  The bleeding should slow down and the cramps should get better, you can keep taking the ibuprofen.  You can discuss long-term management of the bleeding with your OB/GYN.  Continue to monitor how you're doing and return to the ER for new or worsening symptoms.

## 2022-06-16 NOTE — ED Provider Notes (Signed)
[facility] EMERGENCY DEPT Provider Note   CSN: [id number] Arrival date & time: 06/16/22  1559     History  Chief Complaint  Patient presents with   Abdominal Cramping    Brandi Church is a 21 y.o. female who presents to the emergency department complaining of heavy bleeding and abdominal cramping for several days.  She states that the cramping is intermittent, and she was having some bleeding the other day, but it got worse today.  She also noticed some gray vaginal discharge with odor.  She states that she is having to change pads every hour.  She states that ibuprofen has helped her cramping somewhat. She does have history of anemia and is worried about her hemoglobin levels.  She gave birth in May to healthy baby girl, and had a hormonal IUD placed 8 weeks postpartum.  No nausea, vomiting, urinary symptoms, or diarrhea.  She called her OB/GYN to request an appointment, and they recommended she go to the ER for evaluation as their office was closed.   Abdominal Cramping Associated symptoms include abdominal pain.       Home Medications Prior to Admission medications   Medication Sig Start Date End Date Taking? Authorizing Provider  docusate sodium (COLACE) 100 MG capsule Take 100 mg by mouth 2 (two) times daily.    [provider]  ferrous sulfate 325 (65 FE) MG EC tablet Take 1 tablet (325 mg total) by mouth daily with breakfast. If with constipation do use over counter stool softener such as colace. 03/08/22 04/19/22  Hoover Browns, MD  fluticasone (FLOVENT HFA) 110 MCG/ACT inhaler Inhale 2 puffs into the lungs daily as needed.    [provider]  ibuprofen (ADVIL) 600 MG tablet Take 1 tablet (600 mg total) by mouth every 6 (six) hours. 03/08/22   Hoover Browns, MD  Prenat-Fe Poly-Methfol-FA-DHA (VITAFOL ULTRA) 29-0.6-0.4-200 MG CAPS Take 1 capsule by mouth daily before breakfast. 09/12/21   Brock Bad, MD  pseudoephedrine (SUDAFED) 60 MG tablet Take 1  tablet (60 mg total) by mouth once as needed for congestion. Patient not taking: Reported on 03/08/2022 12/17/21   Calvert Cantor, CNM  sertraline (ZOLOFT) 50 MG tablet Take 1 tablet (50 mg total) by mouth daily. 05/10/22   Shanna Cisco, NP      Allergies    Latex    Review of Systems   Review of Systems  Constitutional:  Negative for chills and fever.  Gastrointestinal:  Positive for abdominal pain. Negative for constipation, diarrhea, nausea and vomiting.  Genitourinary:  Positive for vaginal bleeding and vaginal discharge. Negative for dysuria, hematuria and pelvic pain.  All other systems reviewed and are negative.   Physical Exam Updated Vital Signs BP 98/70   Pulse 83   Temp 98.5 F (36.9 C)   Resp 18   SpO2 99%  Physical Exam Vitals and nursing note reviewed. Exam conducted with a chaperone present.  Constitutional:      Appearance: Normal appearance.  HENT:     Head: Normocephalic and atraumatic.  Eyes:     Conjunctiva/sclera: Conjunctivae normal.  Cardiovascular:     Rate and Rhythm: Normal rate and regular rhythm.  Pulmonary:     Effort: Pulmonary effort is normal. No respiratory distress.     Breath sounds: Normal breath sounds.  Abdominal:     General: There is no distension.     Palpations: Abdomen is soft.     Tenderness: There is abdominal tenderness in the suprapubic  area. There is no guarding or rebound.  Genitourinary:    General: Normal vulva.     Exam position: Lithotomy position.     Vagina: Normal.     Cervix: No cervical motion tenderness, discharge or cervical bleeding.  Skin:    General: Skin is warm and dry.  Neurological:     General: No focal deficit present.     Mental Status: She is alert.     ED Results / Procedures / Treatments   Labs (all labs ordered are listed, but only abnormal results are displayed) Labs Reviewed  WET PREP, GENITAL  PREGNANCY, URINE  URINALYSIS, ROUTINE W REFLEX MICROSCOPIC  CBC  BASIC  METABOLIC PANEL  GC/CHLAMYDIA PROBE AMP ([place]) NOT AT [facility]    EKG None  Radiology No results found.  Procedures Procedures    Medications Ordered in ED Medications  cefTRIAXone (ROCEPHIN) injection 500 mg (500 mg Intramuscular Given 06/16/22 2012)  sterile water (preservative free) injection (  Given 06/16/22 2015)    ED Course/ Medical Decision Making/ A&P                           Medical Decision Making Amount and/or Complexity of Data Reviewed Labs: ordered.   This patient is a 21 y.o. female  who presents to the ED for concern of abdominal cramping and vaginal bleeding for 3 days. Gave birth in May, hormonal IUD placed end of June 2023.    Differential diagnoses prior to evaluation: The emergent differential diagnosis includes, but is not limited to,  Abnormal uterine bleeding, threatened miscarriage, incomplete miscarriage, normal bleeding from an early trimester pregnancy, ectopic pregnancy, vaginal/cervical trauma, subchorionic hemorrhage/hematoma.   Past Medical History / Co-morbidities: Asthma, migraines, anxiety, depression  Additional history: Chart reviewed. Pertinent results include: Patient was admitted and was induced at 38 weeks 6 days gestation on 03/06/22.  Physical Exam: Physical exam performed. The pertinent findings include: Normal vital signs. Abdomen soft, with suprapubic tenderness. No guarding.  GU exam without abnormalities, no significant discharge or bleeding noted.  Lab Tests/Imaging studies: I personally interpreted labs/imaging and the pertinent results include: No leukocytosis, normal hemoglobin.  Normal electrolytes and kidney function.  Urinalysis negative for hematuria or infection.  Wet prep negative.  Pregnancy test negative. Gonorrhea chlamydia test pending.  Medications: I ordered medication including Rocephin for possible STDs.  I have reviewed the patients home medicines and have made adjustments as needed.    Disposition: After consideration of the diagnostic results and the patients response to treatment, I feel that emergency department workup does not suggest an emergent condition requiring admission or immediate intervention beyond what has been performed at this time. I suspect that the discharge the patient is experiencing could be related to hormone changes in the setting of postpartum period while having a hormonal IUD.  She knows to follow-up on MyChart for her STD testing and follow-up with either OB/GYN or health department for necessary treatment.  Her hemoglobin levels are reassuring, she is not anemic or requiring blood products. Low suspicion for acute pelvic pathology such as ovarian torsion as patient's symptoms are intermittent and are not reproducible on pelvic exam.   The plan is: Discharge to home with recommendations to follow-up with OB/GYN.  The patient is safe for discharge and has been instructed to return immediately for worsening symptoms, change in symptoms or any other concerns.  Final Clinical Impression(s) / ED Diagnoses Final diagnoses:  Abdominal cramping, bilateral  lower quadrant  Abnormal uterine bleeding  IUD (intrauterine device) in place    Rx / DC Orders ED Discharge Orders     None      Portions of this report may have been transcribed using voice recognition software. Every effort was made to ensure accuracy; however, inadvertent computerized transcription errors may be present.    Jeanella Flattery 06/16/22 2039    Margarita Grizzle, MD 06/16/22 [phone]

## 2022-06-17 NOTE — Plan of Care (Signed)
  Problem: Anxiety Disorder CCP Problem  1  Goal: STG: Patient will practice problem solving skills 3 times per week for the next 4 weeks Outcome: Not Progressing

## 2022-06-18 LAB — GC/CHLAMYDIA PROBE AMP (~~LOC~~) NOT AT ARMC
Chlamydia: NEGATIVE
Comment: NEGATIVE
Comment: NORMAL
Neisseria Gonorrhea: NEGATIVE

## 2022-06-22 DIAGNOSIS — T8389XA Other specified complication of genitourinary prosthetic devices, implants and grafts, initial encounter: Secondary | ICD-10-CM | POA: Diagnosis not present

## 2022-06-22 DIAGNOSIS — N921 Excessive and frequent menstruation with irregular cycle: Secondary | ICD-10-CM | POA: Diagnosis not present

## 2022-06-22 DIAGNOSIS — Z3040 Encounter for surveillance of contraceptives, unspecified: Secondary | ICD-10-CM | POA: Diagnosis not present

## 2022-06-22 DIAGNOSIS — Z30432 Encounter for removal of intrauterine contraceptive device: Secondary | ICD-10-CM | POA: Diagnosis not present

## 2022-06-22 DIAGNOSIS — Z30011 Encounter for initial prescription of contraceptive pills: Secondary | ICD-10-CM | POA: Diagnosis not present

## 2022-07-02 ENCOUNTER — Ambulatory Visit (HOSPITAL_COMMUNITY): Payer: Self-pay | Admitting: Clinical

## 2022-07-06 DIAGNOSIS — Z419 Encounter for procedure for purposes other than remedying health state, unspecified: Secondary | ICD-10-CM | POA: Diagnosis not present

## 2022-07-10 ENCOUNTER — Ambulatory Visit (HOSPITAL_COMMUNITY): Payer: Self-pay | Admitting: Clinical

## 2022-08-05 DIAGNOSIS — Z419 Encounter for procedure for purposes other than remedying health state, unspecified: Secondary | ICD-10-CM | POA: Diagnosis not present

## 2022-08-10 ENCOUNTER — Encounter (HOSPITAL_COMMUNITY): Payer: Self-pay | Admitting: Psychiatry

## 2022-08-10 ENCOUNTER — Telehealth (INDEPENDENT_AMBULATORY_CARE_PROVIDER_SITE_OTHER): Payer: Medicaid Other | Admitting: Psychiatry

## 2022-08-10 DIAGNOSIS — F900 Attention-deficit hyperactivity disorder, predominantly inattentive type: Secondary | ICD-10-CM

## 2022-08-10 DIAGNOSIS — F3341 Major depressive disorder, recurrent, in partial remission: Secondary | ICD-10-CM | POA: Diagnosis not present

## 2022-08-10 DIAGNOSIS — F410 Panic disorder [episodic paroxysmal anxiety] without agoraphobia: Secondary | ICD-10-CM | POA: Diagnosis not present

## 2022-08-10 DIAGNOSIS — F411 Generalized anxiety disorder: Secondary | ICD-10-CM | POA: Diagnosis not present

## 2022-08-10 MED ORDER — SERTRALINE HCL 100 MG PO TABS: 100.0000 mg | ORAL_TABLET | Freq: Every day | ORAL | 3 refills | Status: DC

## 2022-08-10 MED ORDER — LISDEXAMFETAMINE DIMESYLATE 20 MG PO CAPS: 20.0000 mg | ORAL_CAPSULE | Freq: Every day | ORAL | 0 refills | Status: DC

## 2022-08-10 MED ORDER — TRAZODONE HCL 50 MG PO TABS: 25.0000 mg | ORAL_TABLET | Freq: Every day | ORAL | 3 refills | Status: DC

## 2022-08-10 NOTE — Progress Notes (Signed)
[place] MD/PA/NP OP Progress Note Virtual Visit via Telephone Note  I connected with Brandi Church on 08/10/22 at  9:30 AM EDT by telephone and verified that I am speaking with the correct person using two identifiers.  Location: Patient: home Provider: Clinic   I discussed the limitations, risks, security and privacy concerns of performing an evaluation and management service by telephone and the availability of in person appointments. I also discussed with the patient that there may be a patient responsible charge related to this service. The patient expressed understanding and agreed to proceed.   I provided 30 minutes of non-face-to-face time during this encounter.   08/10/2022 9:36 AM Brandi Church  MRN:  [id number]  Chief Complaint: "I have not been motivated"  HPI: 21 year old female seen today for follow up psychiatric evaluation. She is currently she is managed on Lorazepam 0.5 mg as needed (prescribed by her OB/GYN) and Zoloft 50 mg daily. She notes her medications are somewhat effective in managing her psychiatric conditions.   Today she is unable to logon virtually so her assessment was done over the phone.  During exam she was pleasant, cooperative, and engaged in conversation.  She informed Probation officer that she has not been motivated.  She notes that this is interfering with her school.  Patient notes that it is difficult for her to do things that she enjoyed.  She informed Probation officer that at times she feels depressed and has issues attending to activities of daily living.  Since her last visit she reports that her appetite has been poor and also informed writer that she sleeps approximately 4 hours.  Today provider conducted a GAD-7 and patient scored a 15, at her last visit she scored a 16.  She notes that she worries about her 32-month-year-old child, school, finding a job, and finances.  Provider also conducted a PHQ-9 and patient scored a 15, at her last visit she scored an 11.  Today she  endorses passive SI but denies wanting to harm herself.  She denies SI/HI/VAH, mania, paranoia.    Patient informed Probation officer that she continues to have problems with ADHD.  She endorses distractibility, poor concentration, forgetfulness, and inattentiveness to mentally taxing task.  She requested to be restarted back on her Vyvanse.  Provider was agreeable to this.  Patient informed Probation officer that she continues to study at [employer].  She reports her mental health makes it difficult for her to attend class and requests recommending she take classes virtually.  Provider was agreeable to this.  She reports that she gets support from her grandmother and watching her child during the day.  Today she is agreeable to restarting Vyvanse 20 mg to help manage symptoms of ADHD.  Zoloft increased from 50 mg to 100 mg to help manage anxiety and depression.  Patient started on trazodone 25 to 50 mg nightly as needed to help manage sleep. Potential side effects of medication and risks vs benefits of treatment vs non-treatment were explained and discussed. All questions were answered. She will follow-up with outpatient counseling for therapy.  No other concerns at this time.    Visit Diagnosis:    ICD-10-CM   1. Attention deficit hyperactivity disorder (ADHD), predominantly inattentive type  F90.0 lisdexamfetamine (VYVANSE) 20 MG capsule    2. Generalized anxiety disorder with panic attacks  F41.1 sertraline (ZOLOFT) 100 MG tablet   F41.0 traZODone (DESYREL) 50 MG tablet    3. MDD (major depressive disorder), recurrent, in partial remission ([place])  F33.41 sertraline (ZOLOFT) 100 MG tablet    traZODone (DESYREL) 50 MG tablet       Past Psychiatric History: ADHD, GAD, and depression  Past Medical History:  Past Medical History:  Diagnosis Date   Anxiety    Last used rescue inhaler 03/05/22   Asthma    Contusion of right little finger without damage to nail 01/28/2017   Depression    Injury of finger  of right hand 01/28/2017   Migraines     Past Surgical History:  Procedure Laterality Date   NO PAST SURGERIES      Family Psychiatric History: denies  Family History:  Family History  Problem Relation Age of Onset   Healthy Mother    Healthy Father    Hypertension Maternal Grandmother    Hypertension Maternal Grandfather    Diabetes Maternal Grandfather     Social History:  Social History   Socioeconomic History   Marital status: Single    Spouse name: Not on file   Number of children: Not on file   Years of education: Not on file   Highest education level: Not on file  Occupational History   Not on file  Tobacco Use   Smoking status: Never    Passive exposure: Never   Smokeless tobacco: Never  Vaping Use   Vaping Use: Never used  Substance and Sexual Activity   Alcohol use: Never   Drug use: Never   Sexual activity: Yes  Other Topics Concern   Not on file  Social History Narrative   Not on file   Social Determinants of Health   Financial Resource Strain: Not on file  Food Insecurity: Not on file  Transportation Needs: Not on file  Physical Activity: Not on file  Stress: Not on file  Social Connections: Not on file    Allergies:  Allergies  Allergen Reactions   Latex Itching and Swelling    Reports swelling of mouth and itching    Metabolic Disorder Labs: No results found for: "HGBA1C", "MPG" No results found for: "PROLACTIN" No results found for: "CHOL", "TRIG", "HDL", "CHOLHDL", "VLDL", "LDLCALC" No results found for: "TSH"  Therapeutic Level Labs: No results found for: "LITHIUM" No results found for: "VALPROATE" No results found for: "CBMZ"  Current Medications: Current Outpatient Medications  Medication Sig Dispense Refill   lisdexamfetamine (VYVANSE) 20 MG capsule Take 1 capsule (20 mg total) by mouth daily. 30 capsule 0   traZODone (DESYREL) 50 MG tablet Take 0.5-1 tablets (25-50 mg total) by mouth at bedtime. 30 tablet 3    docusate sodium (COLACE) 100 MG capsule Take 100 mg by mouth 2 (two) times daily.     ferrous sulfate 325 (65 FE) MG EC tablet Take 1 tablet (325 mg total) by mouth daily with breakfast. If with constipation do use over counter stool softener such as colace. 42 tablet 0   fluticasone (FLOVENT HFA) 110 MCG/ACT inhaler Inhale 2 puffs into the lungs daily as needed.     ibuprofen (ADVIL) 600 MG tablet Take 1 tablet (600 mg total) by mouth every 6 (six) hours. 30 tablet 0   Prenat-Fe Poly-Methfol-FA-DHA (VITAFOL ULTRA) 29-0.6-0.4-200 MG CAPS Take 1 capsule by mouth daily before breakfast. 90 capsule 4   pseudoephedrine (SUDAFED) 60 MG tablet Take 1 tablet (60 mg total) by mouth once as needed for congestion. (Patient not taking: Reported on 03/08/2022) 30 tablet 0   sertraline (ZOLOFT) 100 MG tablet Take 1 tablet (100 mg total) by mouth daily.  30 tablet 3   No current facility-administered medications for this visit.     Musculoskeletal: Strength & Muscle Tone:  Unable to assess due to telephone visit Gait & Station:  Unable to assess due to telephone visit Patient leans: N/A  Psychiatric Specialty Exam: Review of Systems  unknown if currently breastfeeding.There is no height or weight on file to calculate BMI.  General Appearance:  Unable to assess due to telephone visit  Eye Contact:   Unable to assess due to telephone visit  Speech:  Clear and Coherent and Normal Rate  Volume:  Normal  Mood:  Anxious and Depressed  Affect:  Appropriate  Thought Process:  Coherent, Goal Directed, and Linear  Orientation:  Full (Time, Place, and Person)  Thought Content: WDL and Logical   Suicidal Thoughts:  No  Homicidal Thoughts:  No  Memory:  Immediate;   Good Recent;   Good Remote;   Good  Judgement:  Good  Insight:  Good  Psychomotor Activity:   Unable to assess due to telephone visit  Concentration:  Concentration: Good and Attention Span: Good  Recall:  Good  Fund of Knowledge: Good   Language: Good  Akathisia:   Unable to assess due to telephone visit  Handed:  Right  AIMS (if indicated): not done  Assets:  Communication Skills Desire for Improvement Financial Resources/Insurance Housing Physical Health Social Support  ADL's:  Intact  Cognition: WNL  Sleep:  Poor   Screenings: GAD-7    Flowsheet Row Video Visit from 08/10/2022 in [facility] from 05/10/2022 in [facility] Routine Prenatal from 12/20/2021 in [facility] Video Visit from 06/30/2021 in [facility] Counselor from 03/07/2021 in [facility]  Total GAD-7 Score 15 16 0 11 8      PHQ2-9    Flowsheet Row Video Visit from 08/10/2022 in [facility] from 05/10/2022 in [facility] 2 Counselor from 01/30/2022 in [facility] Routine Prenatal from 12/20/2021 in [facility] Initial Prenatal from 08/17/2021 in [place]  PHQ-2 Total Score 5 3 0 0 0  PHQ-9 Total Score 16 11 3 2 1       Flowsheet Row Video Visit from 08/10/2022 in [facility] ED from 06/16/2022 in [facility] Admission (Discharged) from 03/06/2022 in [facility] 4S Mother Baby Unit  C-SSRS RISK CATEGORY Error: Q3, 4, or 5 should not be populated when Q2 is No No Risk No Risk        Assessment and Plan: Patient endorses anxiety, depression, ADHD, and insomnia. Today she is agreeable to restarting Vyvanse 20 mg to help manage symptoms of ADHD.  Zoloft increased from 50 mg to 100 mg to help manage anxiety and depression.  Patient started on trazodone 25 to 50 mg nightly as needed to help manage sleep.  Provider related for patient to take courses virtually as her mental health improves.  1.  Generalized anxiety disorder with panic attacks  Increased- sertraline (ZOLOFT) 100 MG tablet; Take 1 tablet (100 mg total) by mouth daily.  Dispense: 30 tablet; Refill: 3 Start- traZODone (DESYREL) 50 MG tablet; Take 0.5-1 tablets (25-50 mg total) by mouth at bedtime.  Dispense: 30 tablet; Refill: 3  2. MDD (major depressive disorder), recurrent, in partial remission ([facility])  Increased- sertraline (ZOLOFT) 100 MG  tablet; Take 1 tablet (100 mg total) by mouth daily.  Dispense: 30 tablet; Refill: 3 Start- traZODone (DESYREL) 50 MG tablet; Take 0.5-1 tablets (25-50 mg total) by mouth at bedtime.  Dispense: 30 tablet; Refill: 3  3. Attention deficit hyperactivity disorder (ADHD), predominantly inattentive type  Restart- lisdexamfetamine (VYVANSE) 20 MG capsule; Take 1 capsule (20 mg total) by mouth daily.  Dispense: 30 capsule; Refill: 0   Follow up in 3 months Follow up with therapy   Salley Slaughter, NP 08/10/2022, 9:36 AM

## 2022-08-14 ENCOUNTER — Encounter (HOSPITAL_BASED_OUTPATIENT_CLINIC_OR_DEPARTMENT_OTHER): Payer: Self-pay

## 2022-08-14 ENCOUNTER — Ambulatory Visit (HOSPITAL_BASED_OUTPATIENT_CLINIC_OR_DEPARTMENT_OTHER): Payer: Medicaid Other | Admitting: Nurse Practitioner

## 2022-08-23 ENCOUNTER — Ambulatory Visit (INDEPENDENT_AMBULATORY_CARE_PROVIDER_SITE_OTHER): Payer: Medicaid Other | Admitting: Clinical

## 2022-08-23 DIAGNOSIS — F331 Major depressive disorder, recurrent, moderate: Secondary | ICD-10-CM

## 2022-08-23 NOTE — Progress Notes (Signed)
THERAPIST PROGRESS NOTE Virtual Visit via Video Note  I connected with Brandi Church on 08/23/2022 at  1:00 PM EDT by a video enabled telemedicine application and verified that I am speaking with the correct person using two identifiers.  Location: Patient: home Provider: office   I discussed the limitations of evaluation and management by telemedicine and the availability of in person appointments. The patient expressed understanding and agreed to proceed.   Follow Up Instructions: I discussed the assessment and treatment plan with the patient. The patient was provided an opportunity to ask questions and all were answered. The patient agreed with the plan and demonstrated an understanding of the instructions.   The patient was advised to call back or seek an in-person evaluation if the symptoms worsen or if the condition fails to improve as anticipated.   Session Time: 30 minutes  Participation Level: Active  Behavioral Response: CasualAlertDepressed  Type of Therapy: Individual Therapy  Treatment Goals addressed: client will practice problem solving skills 3 times per weeks for the next 4 weeks  ProgressTowards Goals: Progressing  Interventions: CBT and Supportive  Summary: Brandi Church is a 21 y.o. female who presents for the scheduled appointment oriented x5, appropriately dressed, and friendly.  Client denied hallucinations and delusions. Client reported on today she has been doing okay but struggling with anxiety.  Client reported balancing school work and her own downtime to spend with her daughter has been a challenge.  Client reported she has a lot of guilt for the time that she spent away from her daughter.  Client reported she has had support from her grandmother to watch the baby but her baby will be going into daycare next week.  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  Client reported as far as her friendships she  has noticed that there is still close but the pandemic has changed some.  Client reported they had a discussion where her friend stated she tries to understand but that is not always consistent.  Client reported it makes her sad that sometimes she does not get invited to go out to do things right before.  Client reported she is also doing volunteer hours because she will be pledging to stability.  Client reported she wants to figure out some sort of scheduling to help her create balance during the week. Evidence of progress towards goal:  client reported the use of family support 7 days a week.  Client reported she is medication compliant 7 days/week.  Suicidal/Homicidal: Nowithout intent/plan  Therapist Response:  Therapist began the appointment asking the client how she has been doing since last seen. Therapist used CBT to engage in active listening and positive emotional support. Therapist used CBT to ask the client about medication compliance compared to ongoing symptoms she is experiencing with anxiety. Therapist used CBT to engage in open discussed with client about how anxiety and being a new mom is impacting her daily functioning and how she perceives it has changed her self-care. Therapist used CBT to discuss relaxation techniques and modifying her anxious thoughts. Therapist used CBT ask the client to identify her progress with frequency of use with coping skills with continued practice in her daily activity.    Therapist assigned client homework to complete the worksheet emailed to her.    Plan: Return again in 3 weeks.  Diagnosis: major depressive disorder recurrent episode, moderate  Collaboration of Care: Patient refused AEB none requested by the  client at this time.  Patient/Guardian was advised Release of Information must be obtained prior to any record release in order to collaborate their care with an outside provider. Patient/Guardian was advised if they have not already done  so to contact the registration department to sign all necessary forms in order for [place] to release information regarding their care.   Consent: Patient/Guardian gives verbal consent for treatment and assignment of benefits for services provided during this visit. Patient/Guardian expressed understanding and agreed to proceed.   Neena Rhymes Brandi Sutherland, LCSW 08/23/2022

## 2022-08-25 NOTE — Plan of Care (Signed)
  Problem: Anxiety Disorder CCP Problem  1  Goal: STG: Patient will practice problem solving skills 3 times per week for the next 4 weeks Outcome: Progressing

## 2022-08-29 ENCOUNTER — Ambulatory Visit (HOSPITAL_COMMUNITY): Payer: Medicaid Other | Admitting: Clinical

## 2022-09-04 ENCOUNTER — Other Ambulatory Visit (HOSPITAL_COMMUNITY): Payer: Self-pay | Admitting: Psychiatry

## 2022-09-04 DIAGNOSIS — F3341 Major depressive disorder, recurrent, in partial remission: Secondary | ICD-10-CM

## 2022-09-04 DIAGNOSIS — N94819 Vulvodynia, unspecified: Secondary | ICD-10-CM | POA: Diagnosis not present

## 2022-09-04 DIAGNOSIS — F410 Panic disorder [episodic paroxysmal anxiety] without agoraphobia: Secondary | ICD-10-CM

## 2022-09-04 DIAGNOSIS — N898 Other specified noninflammatory disorders of vagina: Secondary | ICD-10-CM | POA: Diagnosis not present

## 2022-09-05 DIAGNOSIS — Z419 Encounter for procedure for purposes other than remedying health state, unspecified: Secondary | ICD-10-CM | POA: Diagnosis not present

## 2022-09-10 ENCOUNTER — Ambulatory Visit (HOSPITAL_BASED_OUTPATIENT_CLINIC_OR_DEPARTMENT_OTHER): Payer: Medicaid Other | Admitting: Nurse Practitioner

## 2022-09-11 DIAGNOSIS — S3141XA Laceration without foreign body of vagina and vulva, initial encounter: Secondary | ICD-10-CM | POA: Diagnosis not present

## 2022-09-11 DIAGNOSIS — R102 Pelvic and perineal pain: Secondary | ICD-10-CM | POA: Diagnosis not present

## 2022-09-14 ENCOUNTER — Encounter (HOSPITAL_COMMUNITY): Payer: Self-pay

## 2022-09-14 ENCOUNTER — Ambulatory Visit (INDEPENDENT_AMBULATORY_CARE_PROVIDER_SITE_OTHER): Payer: Medicaid Other | Admitting: Clinical

## 2022-09-14 DIAGNOSIS — F331 Major depressive disorder, recurrent, moderate: Secondary | ICD-10-CM | POA: Diagnosis not present

## 2022-09-14 NOTE — Progress Notes (Signed)
   THERAPIST PROGRESS NOTE Virtual Visit via Video Note  I connected with Brandi Church on 09/14/22 at 11:00 AM EST by a video enabled telemedicine application and verified that I am speaking with the correct person using two identifiers.  Location: Patient: home Provider: office   I discussed the limitations of evaluation and management by telemedicine and the availability of in person appointments. The patient expressed understanding and agreed to proceed.  Follow Up Instructions: I discussed the assessment and treatment plan with the patient. The patient was provided an opportunity to ask questions and all were answered. The patient agreed with the plan and demonstrated an understanding of the instructions.   The patient was advised to call back or seek an in-person evaluation if the symptoms worsen or if the condition fails to improve as anticipated.   Session Time: 16 minutes  Participation Level: Active  Behavioral Response: CasualAlertEuthymic  Type of Therapy: Individual Therapy  Treatment Goals addressed: Client will practice behavioral activation skills 3 times per week for the next 12 weeks  ProgressTowards Goals: Progressing  Interventions: CBT and Supportive  Summary:  Brandi Church is a 21 y.o. female who presents for scheduled appointment oriented x5, appropriately dressed, and friendly.  Client denies hallucinations and delusions. Client reported on today she is doing better than she was the last appointment.  Client reported she has been able to carve out some time to spend with her daughter along with her friends.  Client reported to continue to balancing act but her mood is feeling more uplifted.  Client reported she was able to go out for colleges homecoming to a few events which she enjoyed with her friend.  Client reported this coming weekend she will be celebrating her daughter's half birthday with family and friends. Evidence of progress towards goal: Client  is medication compliant 7 days a week.  Client spent at least 2 times within the past month engaging in positive activities with social support.   Suicidal/Homicidal: Nowithout intent/plan  Therapist Response:  Therapist began the appointment asking the client how she been doing since last seen. Therapist used CBT to engage using active listening and positive emotional support. Therapist used CBT to ask client open-ended questions about severity of depressive and anxiety symptoms she is coping. Therapist used CBT to normalize the clients emotions and thoughts. Therapist used CBT to discuss and positively reinforced the use of planning time for herself and friends and wanting to be flexible with expectations of herself. Therapist used CBT ask the client to identify her progress with frequency of use with coping skills with continued practice in her daily activity.    Therapist assigned client homework to practice self-care.   Plan: Return again in 3 weeks.  Diagnosis: MDD, recurrent episode, moderate  Collaboration of Care: Patient refused AEB none requested by the client at this time.  Patient/Guardian was advised Release of Information must be obtained prior to any record release in order to collaborate their care with an outside provider. Patient/Guardian was advised if they have not already done so to contact the registration department to sign all necessary forms in order for [place] to release information regarding their care.   Consent: Patient/Guardian gives verbal consent for treatment and assignment of benefits for services provided during this visit. Patient/Guardian expressed understanding and agreed to proceed.   Neena Rhymes Nnamdi Dacus, LCSW 09/14/2022

## 2022-09-14 NOTE — Plan of Care (Signed)
  Problem: Depression CCP Problem  1  Goal: LTG: Kruti WILL SCORE LESS THAN 10 ON THE PATIENT HEALTH QUESTIONNAIRE (PHQ-9) Outcome: Progressing Goal: STG: Nyema WILL IDENTIFY 3 COGNITIVE PATTERNS AND BELIEFS THAT SUPPORT DEPRESSION Outcome: Progressing Goal: STG: Amirrah WILL PRACTICE BEHAVIORAL ACTIVATION SKILLS 3 TIMES PER WEEK FOR THE NEXT 12 WEEKS Outcome: Progressing   Problem: Anxiety Disorder CCP Problem  1  Goal: LTG: Patient will score less than 5 on the Generalized Anxiety Disorder 7 Scale (GAD-7) Outcome: Progressing   

## 2022-09-17 ENCOUNTER — Ambulatory Visit (HOSPITAL_BASED_OUTPATIENT_CLINIC_OR_DEPARTMENT_OTHER): Payer: Medicaid Other | Admitting: Nurse Practitioner

## 2022-09-20 ENCOUNTER — Ambulatory Visit (HOSPITAL_BASED_OUTPATIENT_CLINIC_OR_DEPARTMENT_OTHER): Payer: Medicaid Other | Admitting: Nurse Practitioner

## 2022-10-03 ENCOUNTER — Ambulatory Visit (INDEPENDENT_AMBULATORY_CARE_PROVIDER_SITE_OTHER): Payer: Medicaid Other | Admitting: Clinical

## 2022-10-03 DIAGNOSIS — F410 Panic disorder [episodic paroxysmal anxiety] without agoraphobia: Secondary | ICD-10-CM

## 2022-10-03 DIAGNOSIS — F411 Generalized anxiety disorder: Secondary | ICD-10-CM | POA: Diagnosis not present

## 2022-10-03 NOTE — Progress Notes (Signed)
THERAPIST PROGRESS NOTE Virtual Visit via Video Note  I connected with Brandi Church on 10/03/2022 at  2:00 PM EST by a video enabled telemedicine application and verified that I am speaking with the correct person using two identifiers.  Location: Patient: home Provider: office   I discussed the limitations of evaluation and management by telemedicine and the availability of in person appointments. The patient expressed understanding and agreed to proceed.   Follow Up Instructions: I discussed the assessment and treatment plan with the patient. The patient was provided an opportunity to ask questions and all were answered. The patient agreed with the plan and demonstrated an understanding of the instructions.   The patient was advised to call back or seek an in-person evaluation if the symptoms worsen or if the condition fails to improve as anticipated.   Session Time: 25 minutes  Participation Level: Active  Behavioral Response: CasualAlertEuthymic  Type of Therapy: Individual Therapy  Treatment Goals addressed: client will identify 3 cognitive patterns and beliefs that support depression  ProgressTowards Goals: Progressing  Interventions: CBT  Summary:  Brandi Church is a 21 y.o. female who presents for the scheduled appointment oriented x 5, appropriately dressed, and friendly.  Client denied hallucinations and delusions. Client reported on today she is doing pretty well.  Client reported she has been working on making intentional time for herself and daughter as well as finishing her school semester with good grades.  Client reported she feels like she is doing better with that and viewing her choices in a positive way and not seeing him negatively as her just spending time away from her child.  Client reported today she had to go pick her daughter from daycare because she was not feeling well.  Client reported otherwise she has experienced some conflict with her friendship  and particularly to her best friend.  Client reported over the time she noticed that her best friend seem to depend on her for emotional support and to be happy.  Client reported they had a disagreement following her friend trying to project blame onto her for a choice that she may that resulted in negative consequence.  Client reported her best friend told her she will be moving to a another college that is out of state.  Client reported she initially had some feelings of guilt that her being up for herself and telling her how her friend was making her feel because her friend to push away.  Client reported ultimately she understands that is not her responsibility to belittle herself to accommodate everybody else.  Client reported she thinks displaced from 1 another will help each other to grow. Evidence of progress towards goal: Client reported reframing negative believes at least 2 times per week or as a stressor presents itself.  Suicidal/Homicidal: Nowithout intent/plan  Therapist Response:  Therapist began the appointment asking client how she has been doing since last seen. Therapist used CBT to engage using active listening and positive emotional support towards her thoughts and feelings. Therapist used CBT to ask open-ended questions to get the client time to discuss stressors in her interpersonal relationships, school, and being a mother. Therapist used CBT to normalize the clients emotional response and engage with her to reframe negative emotions and thoughts of guilt to more balanced thoughts. Therapist used CBT ask the client to identify her progress with frequency of use with coping skills with continued practice in her daily activity.    Therapist assigned to client homework to practice  self-care and being intentional with carving out time to do things that she enjoys.   Plan: Return again in 3 weeks.  Diagnosis: generalized anxiety disorder with panic attacks  Collaboration of Care:  Patient refused AEB none requested by the client.  Patient/Guardian was advised Release of Information must be obtained prior to any record release in order to collaborate their care with an outside provider. Patient/Guardian was advised if they have not already done so to contact the registration department to sign all necessary forms in order for [place] to release information regarding their care.   Consent: Patient/Guardian gives verbal consent for treatment and assignment of benefits for services provided during this visit. Patient/Guardian expressed understanding and agreed to proceed.   Neena Rhymes Lorene Samaan, LCSW 10/03/2022

## 2022-10-05 DIAGNOSIS — Z419 Encounter for procedure for purposes other than remedying health state, unspecified: Secondary | ICD-10-CM | POA: Diagnosis not present

## 2022-10-08 ENCOUNTER — Ambulatory Visit (INDEPENDENT_AMBULATORY_CARE_PROVIDER_SITE_OTHER): Payer: Medicaid Other | Admitting: Family

## 2022-10-08 ENCOUNTER — Encounter: Payer: Self-pay | Admitting: Family

## 2022-10-08 VITALS — BP 110/73 | HR 96 | Temp 98.0°F | Ht 64.0 in | Wt 133.5 lb

## 2022-10-08 DIAGNOSIS — F410 Panic disorder [episodic paroxysmal anxiety] without agoraphobia: Secondary | ICD-10-CM

## 2022-10-08 DIAGNOSIS — F411 Generalized anxiety disorder: Secondary | ICD-10-CM

## 2022-10-08 DIAGNOSIS — R634 Abnormal weight loss: Secondary | ICD-10-CM

## 2022-10-08 DIAGNOSIS — Z113 Encounter for screening for infections with a predominantly sexual mode of transmission: Secondary | ICD-10-CM | POA: Diagnosis not present

## 2022-10-08 HISTORY — DX: Abnormal weight loss: R63.4

## 2022-10-08 NOTE — Patient Instructions (Addendum)
Welcome to [facility], It was a pleasure meeting you today!   I will review your lab results via MyChart in a few days.  I have sent a referral to our Nutrition department. They will call you directly to schedule an appointment. In the meantime refer to the handout attached for ideas to increase your weight.  Please schedule a 2 month follow up visit today or message through MyChart to let me know how you are doing.      PLEASE NOTE: If you had any LAB tests please let us know if you have not heard back within a few days. You may see your results on MyChart before we have a chance to review them but we will give you a call once they are reviewed by [place]. If we ordered any REFERRALS today, please let us know if you have not heard from their office within the next week.  Let us know through MyChart if you are needing REFILLS, or have your pharmacy send [place] the request. You can also use MyChart to communicate with me or any office staff.

## 2022-10-08 NOTE — Assessment & Plan Note (Signed)
chronic pt under therapy, NP care, taking Zoloft and Trazodone prn for sleep, reports her Zoloft dose recently increased will continue to monitor f/u prn

## 2022-10-08 NOTE — Progress Notes (Signed)
New Patient Office Visit  Subjective:  Patient ID: Brandi Church, female    DOB: [birth year]  Age: 21 y.o. MRN: [id number]  CC:  Chief Complaint  Patient presents with   New Patient (Initial Visit)   Weight Loss    Pt c/o rapid weight loss. Lost 8 pounds within a week. Noticed this past month .    HPI Brandi Church presents for establishing care today.  Anxiety:  chronic problem pt recently gave birth 7 mos ago, had been on Zoloft during pregnancy, recently dose increased, too early to notice difference in symptoms yet. Pt is under [facility] care for therapy and meds. Reports stress caring for new baby and going to school FT, but has good family support, living with her grandparents.    10/08/2022    9:59 AM  Depression screen PHQ 2/9  Decreased Interest 2  Down, Depressed, Hopeless 2  PHQ - 2 Score 4  Altered sleeping 3  Tired, decreased energy 3  Change in appetite 3  Feeling bad or failure about yourself  3  Trouble concentrating 3  Moving slowly or fidgety/restless 3  Suicidal thoughts 1  PHQ-9 Score 23  Difficult doing work/chores Very difficult      10/08/2022   10:00 AM  GAD 7 : Generalized Anxiety Score  Nervous, Anxious, on Edge 3  Control/stop worrying 3  Worry too much - different things 3  Trouble relaxing 2  Restless 1  Easily annoyed or irritable 3  Afraid - awful might happen 2  Total GAD 7 Score 17  Anxiety Difficulty Very difficult   Weight loss:  reports 8lbs off in last week. Taking Zoloft 50mg  & 75mg  alternating days, just started this regimen last week. Was on Vyvanse 20mg  a year ago, and has at home but does not take, not since last year. Pt was breast feeding for 5 months and stopped 2 mos ago, since May she has lost 25lbs. which is what she gained during her pregnancy. Last seen by OB 3 weeks ago & advised to see PCP re wt. loss. Going to school full time at [employer], not working currently. Feels stressed taking care of new baby and keeping up with school.  pt states she was underweight prior to her pregnancy. She reports not feeling hungry until the evening, but is forcing herself to eat a granola bar or yogurt during the day. Reports trying Ensure in past but caused constipation.  STD testing:  pt is sexually active, using nuvaring for [facility], denies any sx today, but likes to get screened about q30m  Assessment & Plan:   Problem List Items Addressed This Visit       Other   Generalized anxiety disorder with panic attacks    chronic pt under therapy, NP care, taking Zoloft and Trazodone prn for sleep, reports her Zoloft dose recently increased will continue to monitor f/u prn      Relevant Medications   sertraline (ZOLOFT) 50 MG tablet   LORazepam (ATIVAN) 1 MG tablet   Unintended weight loss - Primary    chronic  pt had a baby 7 months ago, and has lost 25lbs since then, but prior to pregnancy she reports having a hard time gaining weight does have anxiety and some depression, also very busy raising a baby and going to college FT she reports decrease appetite, not hungry until dinner time, tries to eat snacks during day advised pt on high calorie, healthier options, importance of hydration, can look for  protein smoothies with fruit, etc. referring to nutritionist  f/u 2 mos or prn      Relevant Orders   Referral to Nutrition and Diabetes Services   Other Visit Diagnoses     Screening for STD (sexually transmitted disease)       Relevant Orders   Urine cytology ancillary only      Subjective:    Outpatient Medications Prior to Visit  Medication Sig Dispense Refill   etonogestrel-ethinyl estradiol (NUVARING) 0.12-0.015 MG/24HR vaginal ring Place 1 each vaginally every 28 (twenty-eight) days.     fluticasone (FLOVENT HFA) 110 MCG/ACT inhaler Inhale 2 puffs into the lungs daily as needed.     ibuprofen (ADVIL) 600 MG tablet Take 1 tablet (600 mg total) by mouth every 6 (six) hours. (Patient taking differently: Take 600 mg by  mouth as needed for mild pain.) 30 tablet 0   LORazepam (ATIVAN) 1 MG tablet Take 1 mg by mouth as needed for anxiety.     sertraline (ZOLOFT) 50 MG tablet Take 50 mg by mouth daily. Take 75mg  qod.     traZODone (DESYREL) 50 MG tablet TAKE 1/2 TO 1 TABLET (25 TO 50 MG TOTAL) BY MOUTH AT BEDTIME (Patient taking differently: Take 50 mg by mouth as needed for sleep.) 90 tablet 2   VENTOLIN HFA 108 (90 Base) MCG/ACT inhaler Inhale 2 puffs into the lungs every 4 (four) hours as needed for shortness of breath.     docusate sodium (COLACE) 100 MG capsule Take 100 mg by mouth 2 (two) times daily.     lisdexamfetamine (VYVANSE) 20 MG capsule Take 1 capsule (20 mg total) by mouth daily. 30 capsule 0   Prenat-Fe Poly-Methfol-FA-DHA (VITAFOL ULTRA) 29-0.6-0.4-200 MG CAPS Take 1 capsule by mouth daily before breakfast. 90 capsule 4   pseudoephedrine (SUDAFED) 60 MG tablet Take 1 tablet (60 mg total) by mouth once as needed for congestion. 30 tablet 0   sertraline (ZOLOFT) 100 MG tablet TAKE 1 TABLET BY MOUTH EVERY DAY 90 tablet 2   ferrous sulfate 325 (65 FE) MG EC tablet Take 1 tablet (325 mg total) by mouth daily with breakfast. If with constipation do use over counter stool softener such as colace. 42 tablet 0   No facility-administered medications prior to visit.   Past Medical History:  Diagnosis Date   Abnormal prenatal ultrasound 10/18/2021   Bilateral CP Cysts noted. Rescan scheduled   Anemia    Anemia affecting pregnancy in second trimester 12/09/2021   Venofer 11/30/21  Hgb at 28 weeks = 10.5      Anxiety    Last used rescue inhaler 03/05/22   Asthma    Contusion of right little finger without damage to nail 01/28/2017   Depression    Headache disorder 06/22/2020   Injury of finger of right hand 01/28/2017   Migraines    Past Surgical History:  Procedure Laterality Date   NO PAST SURGERIES      Objective:   Today's Vitals: BP 110/73 (BP Location: Left Arm, Patient Position: Sitting,  Cuff Size: Large)   Pulse 96   Temp 98 F (36.7 C) (Temporal)   Ht 5\' 4"  (1.626 m)   Wt 133 lb 8 oz (60.6 kg)   LMP 09/16/2022 (Exact Date)   SpO2 99%   Breastfeeding Yes   BMI 22.92 kg/m   Physical Exam Vitals and nursing note reviewed.  Constitutional:      Appearance: Normal appearance.  Cardiovascular:     Rate  and Rhythm: Normal rate and regular rhythm.  Pulmonary:     Effort: Pulmonary effort is normal.     Breath sounds: Normal breath sounds.  Musculoskeletal:        General: Normal range of motion.  Skin:    General: Skin is warm and dry.  Neurological:     Mental Status: She is alert.  Psychiatric:        Mood and Affect: Mood normal.        Behavior: Behavior normal.     No orders of the defined types were placed in this encounter.   Dulce Sellar, NP

## 2022-10-08 NOTE — Plan of Care (Signed)
  Problem: Depression CCP Problem  1  Goal: LTG: Kathrene WILL SCORE LESS THAN 10 ON THE PATIENT HEALTH QUESTIONNAIRE (PHQ-9) Outcome: Progressing Goal: STG: Yancy WILL IDENTIFY 3 COGNITIVE PATTERNS AND BELIEFS THAT SUPPORT DEPRESSION Outcome: Progressing Goal: STG: Zaleah WILL PRACTICE BEHAVIORAL ACTIVATION SKILLS 3 TIMES PER WEEK FOR THE NEXT 12 WEEKS Outcome: Progressing   Problem: Anxiety Disorder CCP Problem  1  Goal: LTG: Patient will score less than 5 on the Generalized Anxiety Disorder 7 Scale (GAD-7) Outcome: Progressing

## 2022-10-08 NOTE — Assessment & Plan Note (Addendum)
chronic  pt had a baby 7 months ago, and has lost 25lbs since then, but prior to pregnancy she reports having a hard time gaining weight does have anxiety and some depression, also very busy raising a baby and going to college FT she reports decrease appetite, not hungry until dinner time, tries to eat snacks during day advised pt on high calorie, healthier options, importance of hydration, can look for protein smoothies with fruit, etc. referring to nutritionist  f/u 2 mos or prn

## 2022-10-11 DIAGNOSIS — N906 Unspecified hypertrophy of vulva: Secondary | ICD-10-CM | POA: Diagnosis not present

## 2022-10-17 ENCOUNTER — Ambulatory Visit: Payer: Medicaid Other

## 2022-10-18 ENCOUNTER — Telehealth: Payer: Medicaid Other | Admitting: Family

## 2022-10-18 DIAGNOSIS — J029 Acute pharyngitis, unspecified: Secondary | ICD-10-CM | POA: Diagnosis not present

## 2022-10-18 MED ORDER — AMOXICILLIN 500 MG PO CAPS: 500.0000 mg | ORAL_CAPSULE | Freq: Two times a day (BID) | ORAL | 0 refills | Status: AC

## 2022-10-18 NOTE — Progress Notes (Signed)

## 2022-10-19 ENCOUNTER — Encounter (HOSPITAL_COMMUNITY): Payer: Self-pay

## 2022-10-19 ENCOUNTER — Ambulatory Visit (HOSPITAL_COMMUNITY): Payer: Medicaid Other | Admitting: Clinical

## 2022-10-20 ENCOUNTER — Emergency Department (HOSPITAL_BASED_OUTPATIENT_CLINIC_OR_DEPARTMENT_OTHER)
Admission: EM | Admit: 2022-10-20 | Discharge: 2022-10-20 | Disposition: A | Discharge status: Home / Self Care | Payer: Medicaid Other | Attending: Emergency Medicine | Admitting: Emergency Medicine

## 2022-10-20 ENCOUNTER — Encounter (HOSPITAL_BASED_OUTPATIENT_CLINIC_OR_DEPARTMENT_OTHER): Payer: Self-pay

## 2022-10-20 ENCOUNTER — Other Ambulatory Visit: Payer: Self-pay

## 2022-10-20 DIAGNOSIS — J039 Acute tonsillitis, unspecified: Secondary | ICD-10-CM | POA: Diagnosis not present

## 2022-10-20 DIAGNOSIS — B338 Other specified viral diseases: Secondary | ICD-10-CM

## 2022-10-20 DIAGNOSIS — B974 Respiratory syncytial virus as the cause of diseases classified elsewhere: Secondary | ICD-10-CM | POA: Diagnosis not present

## 2022-10-20 DIAGNOSIS — Z1152 Encounter for screening for COVID-19: Secondary | ICD-10-CM | POA: Insufficient documentation

## 2022-10-20 DIAGNOSIS — B9789 Other viral agents as the cause of diseases classified elsewhere: Secondary | ICD-10-CM

## 2022-10-20 DIAGNOSIS — Z8619 Personal history of other infectious and parasitic diseases: Secondary | ICD-10-CM

## 2022-10-20 DIAGNOSIS — J029 Acute pharyngitis, unspecified: Secondary | ICD-10-CM | POA: Diagnosis not present

## 2022-10-20 DIAGNOSIS — Z20822 Contact with and (suspected) exposure to covid-19: Secondary | ICD-10-CM | POA: Diagnosis not present

## 2022-10-20 HISTORY — DX: Personal history of other infectious and parasitic diseases: Z86.19

## 2022-10-20 LAB — RESP PANEL BY RT-PCR (RSV, FLU A&B, COVID)  RVPGX2
Influenza A by PCR: NEGATIVE
Influenza B by PCR: NEGATIVE
Resp Syncytial Virus by PCR: POSITIVE — AB
SARS Coronavirus 2 by RT PCR: NEGATIVE

## 2022-10-20 LAB — GROUP A STREP BY PCR: Group A Strep by PCR: NOT DETECTED

## 2022-10-20 MED ORDER — DEXAMETHASONE 4 MG PO TABS
4.0000 mg | ORAL_TABLET | Freq: Once | ORAL | Status: AC
  Administered 2022-10-20: 4 mg via ORAL
  Filled 2022-10-20: qty 1

## 2022-10-20 NOTE — ED Provider Notes (Signed)
[facility] EMERGENCY DEPT Provider Note   CSN: [id number] Arrival date & time: 10/20/22  1450     History  Chief Complaint  Patient presents with   Sore Throat    Brandi Church is a 21 y.o. female.  Patient with no pertinent past medical history presents today with complaints of sore throat, cough, and congestion. She states that same has been ongoing for the past 2-3 days. She states that she has been able to swallow with some discomfort. She denies fevers or chills. States that her daughter tested positive for RSV a few weeks ago. States she also did a video visit with her pcp 2 days ago and was diagnosed with pharyngitis and started on amoxicillin which she has been taking as prescribed without any relief. She denies any chest pain or shortness of breath. No nausea, vomiting, or diarrhea.  The history is provided by the patient. No language interpreter was used.  Sore Throat       Home Medications Prior to Admission medications   Medication Sig Start Date End Date Taking? Authorizing Provider  amoxicillin (AMOXIL) 500 MG capsule Take 1 capsule (500 mg total) by mouth 2 (two) times daily for 10 days. 10/18/22 10/28/22  Junie Spencer, FNP  etonogestrel-ethinyl estradiol (NUVARING) 0.12-0.015 MG/24HR vaginal ring Place 1 each vaginally every 28 (twenty-eight) days. 10/01/22   [provider]  fluticasone (FLOVENT HFA) 110 MCG/ACT inhaler Inhale 2 puffs into the lungs daily as needed.    [provider]  ibuprofen (ADVIL) 600 MG tablet Take 1 tablet (600 mg total) by mouth every 6 (six) hours. Patient taking differently: Take 600 mg by mouth as needed for mild pain. 03/08/22   Hoover Browns, MD  LORazepam (ATIVAN) 1 MG tablet Take 1 mg by mouth as needed for anxiety.    [provider]  sertraline (ZOLOFT) 50 MG tablet Take 50 mg by mouth daily. Take 75mg  qod.    [provider]  traZODone (DESYREL) 50 MG tablet TAKE 1/2 TO 1 TABLET  (25 TO 50 MG TOTAL) BY MOUTH AT BEDTIME Patient taking differently: Take 50 mg by mouth as needed for sleep. 09/04/22   09/06/22, NP  VENTOLIN HFA 108 (90 Base) MCG/ACT inhaler Inhale 2 puffs into the lungs every 4 (four) hours as needed for shortness of breath. 06/23/22   [provider]      Allergies    Latex    Review of Systems   Review of Systems  HENT:  Positive for congestion and sore throat.   All other systems reviewed and are negative.   Physical Exam Updated Vital Signs BP 115/88 (BP Location: Right Arm)   Pulse (!) 103   Temp 98.1 F (36.7 C) (Oral)   Resp 15   Ht 5\' 4"  (1.626 m)   Wt 60.6 kg   LMP 09/16/2022 (Exact Date)   SpO2 100%   BMI 22.93 kg/m  Physical Exam Vitals and nursing note reviewed.  Constitutional:      General: She is not in acute distress.    Appearance: Normal appearance. She is normal weight. She is not ill-appearing, toxic-appearing or diaphoretic.  HENT:     Head: Normocephalic and atraumatic.     Nose: No congestion.     Mouth/Throat:     Mouth: Mucous membranes are moist.     Pharynx: Uvula midline. No uvula swelling.     Tonsils: Tonsillar exudate present. No tonsillar abscesses. 2+ on the right. 2+  on the left.  Eyes:     Conjunctiva/sclera: Conjunctivae normal.     Pupils: Pupils are equal, round, and reactive to light.  Neck:     Comments: No meningismus Cardiovascular:     Rate and Rhythm: Normal rate and regular rhythm.     Heart sounds: Normal heart sounds.  Pulmonary:     Effort: Pulmonary effort is normal. No respiratory distress.     Breath sounds: Normal breath sounds.  Abdominal:     Palpations: Abdomen is soft.     Tenderness: There is no abdominal tenderness.  Musculoskeletal:        General: Normal range of motion.     Cervical back: Normal range of motion and neck supple.  Lymphadenopathy:     Cervical: No cervical adenopathy.  Skin:    General: Skin is warm and dry.  Neurological:      General: No focal deficit present.     Mental Status: She is alert.  Psychiatric:        Mood and Affect: Mood normal.        Behavior: Behavior normal.     ED Results / Procedures / Treatments   Labs (all labs ordered are listed, but only abnormal results are displayed) Labs Reviewed  RESP PANEL BY RT-PCR (RSV, FLU A&B, COVID)  RVPGX2 - Abnormal; Notable for the following components:      Result Value   Resp Syncytial Virus by PCR POSITIVE (*)    All other components within normal limits  GROUP A STREP BY PCR    EKG None  Radiology No results found.  Procedures Procedures    Medications Ordered in ED Medications  dexamethasone (DECADRON) tablet 4 mg (4 mg Oral Given 10/20/22 1752)    ED Course/ Medical Decision Making/ A&P                           Medical Decision Making Risk Prescription drug management.   Patient presents today with complaints of sore throat x 2-3 days.  She is afebrile, nontoxic-appearing, no acute distress with reassuring vital signs.  Strep swab is negative.  She is positive for RSV, however she states that she has had congestion for over a week and her daughter tested positive for RSV a few weeks ago.  Physical exam reveals bilateral tonsillar exudate with 2+ swelling bilaterally.  Uvula is midline.  Presentation non concerning for PTA or RPA. No trismus or uvula deviation.  Patient given Decadron with suspicion for viral tonsillitis.  She is able to tolerate swallowing this medication without difficulty and is able to drink water in ED without difficulty with intact air way.  Therefore no indication for further evaluation with imaging or laboratory workup.  She is stable to be discharged.  Recommended PCP follow up.  Also educated on red flag symptoms that would prompt immediate return.  Patient discharged in stable condition.   Final Clinical Impression(s) / ED Diagnoses Final diagnoses:  Viral tonsillitis  RSV (respiratory syncytial virus  infection)    Rx / DC Orders ED Discharge Orders     None     An After Visit Summary was printed and given to the patient.     Vear Clock 10/20/22 1906    Rondel Baton, MD 10/22/22 [phone]

## 2022-10-20 NOTE — ED Triage Notes (Signed)
Patient here POV from UC  Sore throat for 2-3 Days associated with headache and neck Pain.   Sent by UC for Evaluation. No Known fevers.   NAD Noted during Triage, A&Ox4. GCS 15. Ambulatory.

## 2022-10-20 NOTE — ED Notes (Signed)
Reviewed AVS/discharge instruction with patient. Time allotted for and all questions answered. Patient is agreeable for d/c and escorted to ed exit by staff.  

## 2022-10-20 NOTE — Discharge Instructions (Addendum)
As we discussed, you tested positive for RSV today.  I suspect that you also have viral tonsillitis.  Given that your strep swab was negative, no antibiotics are indicated.  I have given you a long-acting steroid in the ED today that should continue to treat your symptoms over the next few days.  I also recommend:  Increased fluid intake. Sports drinks offer valuable electrolytes, sugars, and fluids.  Breathing heated mist or steam (vaporizer or shower).  Eating chicken soup or other clear broths, and maintaining good nutrition.   Increasing usage of your inhaler if you have asthma.  Return to work when your temperature has returned to normal.  Gargle warm salt water and spit it out for sore throat. Take benadryl or Zyrtec to decrease sinus secretions.  Follow Up: Follow up with your primary care doctor in 5-7 days for recheck of ongoing symptoms.  Return to emergency department for emergent changing or worsening of symptoms.

## 2022-10-20 NOTE — ED Notes (Signed)
Patient able to drink water and swallow medication tablet

## 2022-10-25 NOTE — Congregational Nurse Program (Unsigned)
Doing well no complaints with infant

## 2022-10-31 ENCOUNTER — Other Ambulatory Visit: Payer: Self-pay | Admitting: Obstetrics and Gynecology

## 2022-11-05 DIAGNOSIS — Z419 Encounter for procedure for purposes other than remedying health state, unspecified: Secondary | ICD-10-CM | POA: Diagnosis not present

## 2022-11-07 ENCOUNTER — Telehealth (HOSPITAL_COMMUNITY): Payer: Medicaid Other | Admitting: Student

## 2022-11-09 DIAGNOSIS — Z113 Encounter for screening for infections with a predominantly sexual mode of transmission: Secondary | ICD-10-CM | POA: Diagnosis not present

## 2022-11-13 ENCOUNTER — Ambulatory Visit: Payer: Medicaid Other | Admitting: Registered"

## 2022-11-14 ENCOUNTER — Telehealth (HOSPITAL_COMMUNITY): Payer: Medicaid Other | Admitting: Student

## 2022-11-23 DIAGNOSIS — R110 Nausea: Secondary | ICD-10-CM | POA: Diagnosis not present

## 2022-11-23 DIAGNOSIS — J029 Acute pharyngitis, unspecified: Secondary | ICD-10-CM | POA: Diagnosis not present

## 2022-11-23 DIAGNOSIS — Z20822 Contact with and (suspected) exposure to covid-19: Secondary | ICD-10-CM | POA: Diagnosis not present

## 2022-11-24 ENCOUNTER — Emergency Department (HOSPITAL_BASED_OUTPATIENT_CLINIC_OR_DEPARTMENT_OTHER)
Admission: EM | Admit: 2022-11-24 | Discharge: 2022-11-24 | Disposition: A | Discharge status: Home / Self Care | Payer: Medicaid Other | Attending: Emergency Medicine | Admitting: Emergency Medicine

## 2022-11-24 ENCOUNTER — Encounter (HOSPITAL_BASED_OUTPATIENT_CLINIC_OR_DEPARTMENT_OTHER): Payer: Self-pay

## 2022-11-24 DIAGNOSIS — J029 Acute pharyngitis, unspecified: Secondary | ICD-10-CM | POA: Insufficient documentation

## 2022-11-24 DIAGNOSIS — Z91040 Latex allergy status: Secondary | ICD-10-CM | POA: Diagnosis not present

## 2022-11-24 DIAGNOSIS — Z8709 Personal history of other diseases of the respiratory system: Secondary | ICD-10-CM

## 2022-11-24 DIAGNOSIS — R531 Weakness: Secondary | ICD-10-CM | POA: Diagnosis present

## 2022-11-24 DIAGNOSIS — D649 Anemia, unspecified: Secondary | ICD-10-CM | POA: Insufficient documentation

## 2022-11-24 HISTORY — DX: Personal history of other diseases of the respiratory system: Z87.09

## 2022-11-24 LAB — CBC WITH DIFFERENTIAL/PLATELET
Abs Immature Granulocytes: 0.02 10*3/uL (ref 0.00–0.07)
Basophils Absolute: 0 10*3/uL (ref 0.0–0.1)
Basophils Relative: 0 %
Eosinophils Absolute: 0 10*3/uL (ref 0.0–0.5)
Eosinophils Relative: 0 %
HCT: 36.2 % (ref 36.0–46.0)
Hemoglobin: 11.5 g/dL — ABNORMAL LOW (ref 12.0–15.0)
Immature Granulocytes: 0 %
Lymphocytes Relative: 22 %
Lymphs Abs: 1.7 10*3/uL (ref 0.7–4.0)
MCH: 25.1 pg — ABNORMAL LOW (ref 26.0–34.0)
MCHC: 31.8 g/dL (ref 30.0–36.0)
MCV: 78.9 fL — ABNORMAL LOW (ref 80.0–100.0)
Monocytes Absolute: 0.9 10*3/uL (ref 0.1–1.0)
Monocytes Relative: 12 %
Neutro Abs: 5.1 10*3/uL (ref 1.7–7.7)
Neutrophils Relative %: 66 %
Platelets: 299 10*3/uL (ref 150–400)
RBC: 4.59 MIL/uL (ref 3.87–5.11)
RDW: 12.8 % (ref 11.5–15.5)
WBC: 7.8 10*3/uL (ref 4.0–10.5)
nRBC: 0 % (ref 0.0–0.2)

## 2022-11-24 LAB — COMPREHENSIVE METABOLIC PANEL
ALT: 7 U/L (ref 0–44)
AST: 18 U/L (ref 15–41)
Albumin: 3.7 g/dL (ref 3.5–5.0)
Alkaline Phosphatase: 45 U/L (ref 38–126)
Anion gap: 11 (ref 5–15)
BUN: 13 mg/dL (ref 6–20)
CO2: 22 mmol/L (ref 22–32)
Calcium: 8.9 mg/dL (ref 8.9–10.3)
Chloride: 105 mmol/L (ref 98–111)
Creatinine, Ser: 0.65 mg/dL (ref 0.44–1.00)
GFR, Estimated: >60 mL/min (ref >60)
Glucose, Bld: 98 mg/dL (ref 70–99)
Potassium: 3.6 mmol/L (ref 3.5–5.1)
Sodium: 138 mmol/L (ref 135–145)
Total Bilirubin: 0.3 mg/dL (ref 0.3–1.2)
Total Protein: 7.5 g/dL (ref 6.5–8.1)

## 2022-11-24 LAB — HCG, SERUM, QUALITATIVE: Preg, Serum: NEGATIVE

## 2022-11-24 LAB — MONONUCLEOSIS SCREEN: Mono Screen: NEGATIVE

## 2022-11-24 MED ORDER — LIDOCAINE HCL (PF) 1 % IJ SOLN
INTRAMUSCULAR | Status: AC
  Administered 2022-11-24: 2 mL via INTRAMUSCULAR
  Filled 2022-11-24: qty 5

## 2022-11-24 MED ORDER — DEXAMETHASONE SODIUM PHOSPHATE 10 MG/ML IJ SOLN
10.0000 mg | Freq: Once | INTRAMUSCULAR | Status: AC
  Administered 2022-11-24: 10 mg via INTRAVENOUS
  Filled 2022-11-24: qty 1

## 2022-11-24 MED ORDER — CEFTRIAXONE SODIUM 500 MG IJ SOLR
500.0000 mg | Freq: Once | INTRAMUSCULAR | Status: AC
  Administered 2022-11-24: 500 mg via INTRAMUSCULAR
  Filled 2022-11-24: qty 500

## 2022-11-24 MED ORDER — KETOROLAC TROMETHAMINE 30 MG/ML IJ SOLN
30.0000 mg | Freq: Once | INTRAMUSCULAR | Status: AC
  Administered 2022-11-24: 30 mg via INTRAVENOUS
  Filled 2022-11-24: qty 1

## 2022-11-24 MED ORDER — SODIUM CHLORIDE 0.9 % IV BOLUS
1000.0000 mL | Freq: Once | INTRAVENOUS | Status: AC
  Administered 2022-11-24: 1000 mL via INTRAVENOUS

## 2022-11-24 NOTE — ED Provider Notes (Signed)
[facility] Provider Note   CSN: [id number] Arrival date & time: 11/24/22  1452     History  No chief complaint on file.   Brandi Church is a 22 y.o. female.  Presenting with sore throat that is been going on for the last 3 days.  She went to urgent care where she tested negative for strep but has been on amoxicillin and was given a dose of Decadron.  She said she cannot swallow and feels like she cannot breathe and arrives here in a panic.  She is drooling and does not want to talk.  She is not sure if there is been a fever.  No vomiting.  Feeling generally weak.  The history is provided by the patient.  Sore Throat This is a new problem. The current episode started more than 2 days ago. The problem occurs constantly. The problem has been rapidly worsening. Pertinent negatives include no chest pain, no abdominal pain, no headaches and no shortness of breath. The symptoms are aggravated by swallowing. Nothing relieves the symptoms. She has tried acetaminophen and rest (amox) for the symptoms. The treatment provided no relief.       Home Medications Prior to Admission medications   Medication Sig Start Date End Date Taking? Authorizing Provider  etonogestrel-ethinyl estradiol (NUVARING) 0.12-0.015 MG/24HR vaginal ring Place 1 each vaginally every 28 (twenty-eight) days. 10/01/22   [provider]  fluticasone (FLOVENT HFA) 110 MCG/ACT inhaler Inhale 2 puffs into the lungs daily as needed.    [provider]  ibuprofen (ADVIL) 600 MG tablet Take 1 tablet (600 mg total) by mouth every 6 (six) hours. Patient taking differently: Take 600 mg by mouth as needed for mild pain. 03/08/22   Waymon Amato, MD  LORazepam (ATIVAN) 1 MG tablet Take 1 mg by mouth as needed for anxiety.    [provider]  sertraline (ZOLOFT) 50 MG tablet Take 50 mg by mouth daily. Take 75mg  qod.    [provider]  traZODone (DESYREL) 50 MG  tablet TAKE 1/2 TO 1 TABLET (25 TO 50 MG TOTAL) BY MOUTH AT BEDTIME Patient taking differently: Take 50 mg by mouth as needed for sleep. 09/04/22   Salley Slaughter, NP  VENTOLIN HFA 108 (90 Base) MCG/ACT inhaler Inhale 2 puffs into the lungs every 4 (four) hours as needed for shortness of breath. 06/23/22   [provider]      Allergies    Latex    Review of Systems   Review of Systems  Constitutional:  Negative for fever.  HENT:  Positive for drooling, sore throat and trouble swallowing.   Eyes:  Negative for visual disturbance.  Respiratory:  Negative for shortness of breath.   Cardiovascular:  Negative for chest pain.  Gastrointestinal:  Negative for abdominal pain.  Genitourinary:  Negative for dysuria.  Skin:  Negative for rash.  Neurological:  Negative for headaches.    Physical Exam Updated Vital Signs There were no vitals taken for this visit. Physical Exam Vitals and nursing note reviewed.  Constitutional:      General: She is not in acute distress.    Appearance: Normal appearance. She is well-developed.  HENT:     Head: Normocephalic and atraumatic.     Mouth/Throat:     Mouth: Mucous membranes are moist.     Palate: No mass.     Tonsils: Tonsillar exudate present. No tonsillar abscesses. 3+ on the right. 3+ on the left.  Comments: She has symmetrically enlarged tonsils and has yellow exudate.  Uvula is midline.  There is no stridor.  Neck is supple without crepitus Eyes:     Conjunctiva/sclera: Conjunctivae normal.  Cardiovascular:     Rate and Rhythm: Normal rate and regular rhythm.     Heart sounds: No murmur heard. Pulmonary:     Effort: Pulmonary effort is normal. No respiratory distress.     Breath sounds: Normal breath sounds.  Abdominal:     Palpations: Abdomen is soft.     Tenderness: There is no abdominal tenderness. There is no guarding or rebound.  Musculoskeletal:        General: No deformity. Normal range of motion.      Cervical back: Neck supple.  Skin:    General: Skin is warm and dry.     Capillary Refill: Capillary refill takes less than 2 seconds.  Neurological:     General: No focal deficit present.     Mental Status: She is alert.     Sensory: No sensory deficit.     Motor: No weakness.     Gait: Gait normal.     ED Results / Procedures / Treatments   Labs (all labs ordered are listed, but only abnormal results are displayed) Labs Reviewed  CBC WITH DIFFERENTIAL/PLATELET - Abnormal; Notable for the following components:      Result Value   Hemoglobin 11.5 (*)    MCV 78.9 (*)    MCH 25.1 (*)    All other components within normal limits  HCG, SERUM, QUALITATIVE  MONONUCLEOSIS SCREEN  COMPREHENSIVE METABOLIC PANEL  GC/CHLAMYDIA PROBE AMP ([place]) NOT AT [facility]    EKG None  Radiology No results found.  Procedures Procedures    Medications Ordered in ED Medications  dexamethasone (DECADRON) injection 10 mg (has no administration in time range)  sodium chloride 0.9 % bolus 1,000 mL (has no administration in time range)  ketorolac (TORADOL) 30 MG/ML injection 30 mg (has no administration in time range)    ED Course/ Medical Decision Making/ A&P Clinical Course as of 11/25/22 0957  Sat Nov 24, 2022  1702 Patient is tolerating p.o. now.  Will discharge have her continue her antibiotics. [MB]    Clinical Course User Index [MB] Hayden Rasmussen, MD                             Medical Decision Making Amount and/or Complexity of Data Reviewed Labs: ordered.  Risk Prescription drug management.   This patient complains of sore throat difficulty swallowing; this involves an extensive number of treatment Options and is a complaint that carries with it a high risk of complications and morbidity. The differential includes pharyngitis, peritonsillar abscess, mono, retropharyngeal abscess  I ordered, reviewed and interpreted labs, which included CBC with normal white count,  hemoglobin slightly low, chemistries LFTs Monospot negative, pregnancy negative, gonorrhea swab sent I ordered medication IV fluids Toradol Decadron ceftriaxone and reviewed PMP when indicated. Additional history obtained from patient's companion Previous records obtained and reviewed in epic including recent urgent care note Cardiac monitoring reviewed, normal sinus rhythm Social determinants considered, patient with history of depression Critical Interventions: None  After the interventions stated above, I reevaluated the patient and found patient to be symptomatically improved tolerating p.o. Admission and further testing considered, do not see any evidence of abscess or other distortion of airway.  Will give contact information to follow-up with ENT  and recommended continuing antibiotics and hydration.  Return instructions discussed.         Final Clinical Impression(s) / ED Diagnoses Final diagnoses:  Acute pharyngitis, unspecified etiology    Rx / DC Orders ED Discharge Orders     None         Terrilee Files, MD 11/25/22 1000

## 2022-11-24 NOTE — ED Triage Notes (Signed)
She c/o "sore throat and I can't swallow and it's hard to breath" x 4 days. Seen at Urgent Care yesterday and tested NEGATIVE for strep and was given a "steroid shot". She is quite anxious. Upon inspecting her post. Oropharynx, she is found to have mild-mod. Bilat. Tonsil edema with no blockage of airway and no stridor, nor any adventitious breath sounds bilat.

## 2022-11-24 NOTE — ED Notes (Signed)
Patient seemed to be anxious when being wheeled into room.  Her breathing calmed once she was placed on the stretcher and given a warm blanket.  She was reassured that her airway was open and that her O2 sat=100%  She was given a suction yonker to manage her secretions because she says it hurts to swallow.

## 2022-11-24 NOTE — ED Notes (Signed)
Called by Registration for assistance in lobby.  Upon arrival patient was in wheelchair outside of car.  Patient was not talking and appeared limp in wheelchair. Patient's family member states "she can't breathe, her throat has closed up".  Patent taken back to room where she begain speaking in sentences and was able to transfer to the stretcher.  Observed clear airway.  Dillon Bjork, RN notified.

## 2022-11-24 NOTE — ED Notes (Signed)
Ice water given for PO challenge. Pt able to drink fluids w/o incident. Pt resting comfortably in bed.

## 2022-11-24 NOTE — Discharge Instructions (Signed)
You were seen in the emergency department for painful sore throat.  Please continue your antibiotics, use Tylenol and ibuprofen for pain and drink plenty of fluids.  Follow-up with your regular doctor.  You can also follow-up with ENT.  Return to the emergency department if any worsening or concerning symptoms

## 2022-11-26 ENCOUNTER — Ambulatory Visit (INDEPENDENT_AMBULATORY_CARE_PROVIDER_SITE_OTHER): Payer: Medicaid Other | Admitting: Family

## 2022-11-26 ENCOUNTER — Encounter: Payer: Self-pay | Admitting: Family

## 2022-11-26 VITALS — BP 95/68 | HR 112 | Temp 98.0°F | Ht 64.0 in | Wt 130.0 lb

## 2022-11-26 DIAGNOSIS — J039 Acute tonsillitis, unspecified: Secondary | ICD-10-CM | POA: Diagnosis not present

## 2022-11-26 LAB — GC/CHLAMYDIA PROBE AMP (~~LOC~~) NOT AT ARMC
Chlamydia: NEGATIVE
Comment: NEGATIVE
Comment: NORMAL
Neisseria Gonorrhea: NEGATIVE

## 2022-11-26 MED ORDER — METHYLPREDNISOLONE 4 MG PO TBPK: ORAL_TABLET | ORAL | 0 refills | Status: DC

## 2022-11-26 NOTE — Progress Notes (Signed)
Patient ID: Brandi Church, female    DOB: 16-Dec-2000, 22 y.o.   MRN: [id number]  Chief Complaint  Patient presents with   Sore Throat    Pt states she has recurrent tonsillitis, diagnosed again on 1/20, Neck pain and unable to turn her neck. Fourth time in the past 6 months. Pt states amoxicillin is not helping.     HPI:      Sore throat/swollen tonsils:  started back in December, treated several times with AMOX via virtual but no testing, all testing negative, given steroids in ER.  Then sx returned last week and saw ER on Friday and got a Rocephin shot and Toradol shot. All testing neg, STD still pending. But still having pain in her throat, whispering, neck and trouble swallowing. Denies fever or other sinus symptoms.    Assessment & Plan:  1. Tonsillitis with exudate - large amount of tan exudate noted surrounding and posterior to bilateral tonsils, with 3+ edema & erythema. pt given Rocephin shot in ER, still waiting on gonorrhea testing, all other viral testing and strep negative. Sending steroid pack today.   - methylPREDNISolone (MEDROL DOSEPAK) 4 MG TBPK tablet; Take in the morning with breakfast: 6 pills 1st day, 5pills 2nd day, 4 pills 3rd day, 3 pills, 4th day, 2 pills 5th day, 1 pill 6th day  Dispense: 1 tablet; Refill: 0 - Ambulatory referral to ENT   Subjective:    Outpatient Medications Prior to Visit  Medication Sig Dispense Refill   amoxicillin (AMOXIL) 500 MG capsule Take by mouth.     etonogestrel-ethinyl estradiol (NUVARING) 0.12-0.015 MG/24HR vaginal ring Place 1 each vaginally every 28 (twenty-eight) days.     fluticasone (FLOVENT HFA) 110 MCG/ACT inhaler Inhale 2 puffs into the lungs daily as needed.     ibuprofen (ADVIL) 600 MG tablet Take 1 tablet (600 mg total) by mouth every 6 (six) hours. (Patient taking differently: Take 600 mg by mouth as needed for mild pain.) 30 tablet 0   LORazepam (ATIVAN) 1 MG tablet Take 1 mg by mouth as needed for anxiety.      sertraline (ZOLOFT) 50 MG tablet Take 50 mg by mouth daily. Take 75mg  qod.     traZODone (DESYREL) 50 MG tablet TAKE 1/2 TO 1 TABLET (25 TO 50 MG TOTAL) BY MOUTH AT BEDTIME (Patient taking differently: Take 50 mg by mouth as needed for sleep.) 90 tablet 2   VENTOLIN HFA 108 (90 Base) MCG/ACT inhaler Inhale 2 puffs into the lungs every 4 (four) hours as needed for shortness of breath.     No facility-administered medications prior to visit.   Past Medical History:  Diagnosis Date   Abnormal prenatal ultrasound 10/18/2021   Bilateral CP Cysts noted. Rescan scheduled   Anemia    Anemia affecting pregnancy in second trimester 12/09/2021   Venofer 11/30/21  Hgb at 28 weeks = 10.5      Anxiety    Last used rescue inhaler 03/05/22   Asthma    Contusion of right little finger without damage to nail 01/28/2017   Depression    Headache disorder 06/22/2020   Injury of finger of right hand 01/28/2017   Migraines    Past Surgical History:  Procedure Laterality Date   NO PAST SURGERIES     Allergies  Allergen Reactions   Latex Itching and Swelling    Reports swelling of mouth and itching      Objective:    Physical Exam Vitals and nursing note  reviewed.  Constitutional:      Appearance: Normal appearance.  HENT:     Mouth/Throat:     Mouth: Mucous membranes are moist.     Pharynx: Pharyngeal swelling, oropharyngeal exudate and posterior oropharyngeal erythema present. No uvula swelling.     Tonsils: Tonsillar exudate (tan in color, all along perimeter and posterior of both tonsils) and tonsillar abscess present. 3+ on the right. 3+ on the left.  Cardiovascular:     Rate and Rhythm: Normal rate and regular rhythm.  Pulmonary:     Effort: Pulmonary effort is normal.     Breath sounds: Normal breath sounds.  Musculoskeletal:        General: Normal range of motion.  Skin:    General: Skin is warm and dry.  Neurological:     Mental Status: She is alert.  Psychiatric:        Mood and  Affect: Mood normal.        Behavior: Behavior normal.    BP 95/68 (BP Location: Left Arm, Patient Position: Sitting, Cuff Size: Large)   Pulse (!) 112   Temp 98 F (36.7 C) (Temporal)   Ht 5\' 4"  (1.626 m)   Wt 130 lb (59 kg)   LMP 11/14/2022 (Exact Date)   SpO2 99%   Breastfeeding No   BMI 22.31 kg/m  Wt Readings from Last 3 Encounters:  11/26/22 130 lb (59 kg) (55 %, Z= 0.12)*  10/20/22 133 lb 9.6 oz (60.6 kg) (61 %, Z= 0.29)*  10/08/22 133 lb 8 oz (60.6 kg) (61 %, Z= 0.29)*   * Growth percentiles are based on CDC (Girls, 2-20 Years) data.       Jeanie Sewer, NP

## 2022-11-27 ENCOUNTER — Ambulatory Visit: Payer: Medicaid Other | Admitting: Family

## 2022-11-28 ENCOUNTER — Encounter: Payer: Self-pay | Admitting: Family

## 2022-11-28 ENCOUNTER — Encounter (HOSPITAL_BASED_OUTPATIENT_CLINIC_OR_DEPARTMENT_OTHER): Payer: Self-pay | Admitting: Obstetrics and Gynecology

## 2022-11-28 NOTE — Progress Notes (Addendum)
Spoke w/ via phone for pre-op interview---pt  Lab needs dos---- CBC, BMP, UPT              Lab results------n/a COVID test -----patient states asymptomatic no test needed Arrive at -------1130 NPO after MN NO Solid Food.  Clear liquids from MN until---1030 Med rec completed Medications to take morning of surgery -----Lorazepam, Ventolin inhaler Diabetic medication -----n/a Patient instructed no nail polish to be worn day of surgery Patient instructed to bring photo id and insurance card day of surgery Patient aware to have Driver (ride ) / grandmother Cynthis Beller caregiver    for 24 hours after surgery  Patient Special Instructions -----bring ventolin inhaler to sugery center the DOS. Pre-Op special Istructions -----n/a Patient verbalized understanding of instructions that were given at this phone interview. Patient denies shortness of breath, chest pain, fever, cough at this phone interview.   Pt notified that children under 20 yrs old are not allowed in surgery center.    Pt on prednisone and told to take her daily dose that day after surgery.  Will take 2 pills and will be her 5th day.     Pt was dx with tonsillitis on 11/25/2022.

## 2022-11-29 NOTE — Progress Notes (Addendum)
Pt surgery scheduled for 11-30-2022 by Dr A. Roberts @[facility] .  Chart reviewed by anesthesia, Dr Meredith Mody MDA since pt was dx Tonsillitis w/ exudate by pcp Jeanie Sewer NP dated 11-26-2022 in epic and pt has hard time swallowing due to tonsillar edema. Per Dr Meredith Mody stated pt will need to be completely well before her surgery ,and will need to reschedule. Called and left message for OR scheduler for Dr Mancel Bale, Nolon Bussing, informed her the pt will need to be rescheduled or cancelled until she is completely well per anesthesia.

## 2022-11-30 ENCOUNTER — Ambulatory Visit (HOSPITAL_BASED_OUTPATIENT_CLINIC_OR_DEPARTMENT_OTHER)
Admission: RE | Admit: 2022-11-30 | Payer: Medicaid Other | Source: Home / Self Care | Admitting: Obstetrics and Gynecology

## 2022-11-30 HISTORY — DX: Attention-deficit hyperactivity disorder, unspecified type: F90.9

## 2022-11-30 SURGERY — LABIAPLASTY, VULVA
Anesthesia: Choice

## 2022-12-03 ENCOUNTER — Ambulatory Visit: Payer: Medicaid Other | Admitting: Family

## 2022-12-06 DIAGNOSIS — Z419 Encounter for procedure for purposes other than remedying health state, unspecified: Secondary | ICD-10-CM | POA: Diagnosis not present

## 2022-12-25 ENCOUNTER — Encounter (HOSPITAL_BASED_OUTPATIENT_CLINIC_OR_DEPARTMENT_OTHER): Payer: Self-pay | Admitting: Obstetrics and Gynecology

## 2022-12-25 NOTE — Progress Notes (Signed)
Spoke w/ via phone for pre-op interview--- pt Lab needs dos----   urine preg  (per anes)            Lab results------ no COVID test -----patient states asymptomatic no test needed Arrive at ------- 1200 on 12-31-2022 NPO after MN NO Solid Food.  Clear liquids from MN until--- 1100 Med rec completed Medications to take morning of surgery ----- zoloft, ativan if needed Diabetic medication ----- n/a Patient instructed no nail polish to be worn day of surgery Patient instructed to bring photo id and insurance card day of surgery Patient aware to have Driver (ride ) / caregiver    for 24 hours after surgery -- grandmother, cynthia Patient Special Instructions -----asked to bring both inhalers dos Pre-Op special Istructions ----- sent inbox message to dr Mancel Bale in epic, requested orders Patient verbalized understanding of instructions that were given at this phone interview. Patient denies shortness of breath, chest pain, fever, cough at this phone interview.

## 2022-12-28 ENCOUNTER — Encounter (HOSPITAL_COMMUNITY): Payer: Self-pay | Admitting: Obstetrics and Gynecology

## 2022-12-28 ENCOUNTER — Other Ambulatory Visit: Payer: Self-pay

## 2022-12-28 NOTE — Progress Notes (Signed)
Brandi Church denies chest pain or shortness of breath.  Patient denies having any s/s of Covid in her household, also denies any known exposure to Covid.   Brandi Church PCP is Jeanie Sewer, NP at  [place].

## 2022-12-31 ENCOUNTER — Other Ambulatory Visit: Payer: Self-pay

## 2022-12-31 ENCOUNTER — Ambulatory Visit (HOSPITAL_BASED_OUTPATIENT_CLINIC_OR_DEPARTMENT_OTHER): Payer: Medicaid Other | Admitting: Anesthesiology

## 2022-12-31 ENCOUNTER — Encounter (HOSPITAL_COMMUNITY): Payer: Self-pay | Admitting: Obstetrics and Gynecology

## 2022-12-31 ENCOUNTER — Encounter (HOSPITAL_COMMUNITY)
Admission: RE | Disposition: A | Discharge status: Home / Self Care | Payer: Self-pay | Source: Home / Self Care | Attending: Obstetrics and Gynecology

## 2022-12-31 ENCOUNTER — Ambulatory Visit (HOSPITAL_COMMUNITY)
Admission: RE | Admit: 2022-12-31 | Discharge: 2022-12-31 | Disposition: A | Discharge status: Home / Self Care | Payer: Medicaid Other | Attending: Obstetrics and Gynecology | Admitting: Obstetrics and Gynecology

## 2022-12-31 ENCOUNTER — Ambulatory Visit (HOSPITAL_COMMUNITY): Payer: Medicaid Other | Admitting: Anesthesiology

## 2022-12-31 DIAGNOSIS — N906 Unspecified hypertrophy of vulva: Secondary | ICD-10-CM | POA: Diagnosis not present

## 2022-12-31 DIAGNOSIS — Z01818 Encounter for other preprocedural examination: Secondary | ICD-10-CM

## 2022-12-31 HISTORY — DX: Other specified postprocedural states: R11.2

## 2022-12-31 HISTORY — DX: Other complications of anesthesia, initial encounter: T88.59XA

## 2022-12-31 HISTORY — DX: Major depressive disorder, single episode, unspecified: F32.9

## 2022-12-31 HISTORY — PX: LABIOPLASTY: SHX1900

## 2022-12-31 HISTORY — DX: Generalized anxiety disorder: F41.1

## 2022-12-31 HISTORY — DX: Other specified postprocedural states: Z98.890

## 2022-12-31 LAB — POCT PREGNANCY, URINE: Preg Test, Ur: NEGATIVE

## 2022-12-31 SURGERY — LABIAPLASTY, VULVA
Anesthesia: General

## 2022-12-31 MED ORDER — OXYCODONE HCL 5 MG PO TABS: 5.0000 mg | ORAL_TABLET | Freq: Four times a day (QID) | ORAL | 0 refills | Status: DC | PRN

## 2022-12-31 MED ORDER — PROPOFOL 10 MG/ML IV BOLUS
INTRAVENOUS | Status: DC | PRN
  Administered 2022-12-31: 200 mg via INTRAVENOUS

## 2022-12-31 MED ORDER — KETOROLAC TROMETHAMINE 30 MG/ML IJ SOLN: 30.0000 mg | Freq: Once | INTRAMUSCULAR | Status: DC | PRN

## 2022-12-31 MED ORDER — ONDANSETRON HCL 4 MG/2ML IJ SOLN
INTRAMUSCULAR | Status: AC
  Filled 2022-12-31: qty 2

## 2022-12-31 MED ORDER — LIDOCAINE-EPINEPHRINE 1 %-1:100000 IJ SOLN
INTRAMUSCULAR | Status: AC
  Filled 2022-12-31: qty 1

## 2022-12-31 MED ORDER — MIDAZOLAM HCL 5 MG/5ML IJ SOLN
INTRAMUSCULAR | Status: DC | PRN
  Administered 2022-12-31: 2 mg via INTRAVENOUS

## 2022-12-31 MED ORDER — ONDANSETRON HCL 4 MG/2ML IJ SOLN: 4.0000 mg | Freq: Once | INTRAMUSCULAR | Status: DC | PRN

## 2022-12-31 MED ORDER — PROPOFOL 10 MG/ML IV BOLUS
INTRAVENOUS | Status: AC
  Filled 2022-12-31: qty 20

## 2022-12-31 MED ORDER — LIDOCAINE 5 % EX OINT: 1.0000 | TOPICAL_OINTMENT | Freq: Four times a day (QID) | CUTANEOUS | 0 refills | Status: DC | PRN

## 2022-12-31 MED ORDER — FENTANYL CITRATE (PF) 100 MCG/2ML IJ SOLN
INTRAMUSCULAR | Status: DC | PRN
  Administered 2022-12-31 (×3): 50 ug via INTRAVENOUS

## 2022-12-31 MED ORDER — MIDAZOLAM HCL 2 MG/2ML IJ SOLN
INTRAMUSCULAR | Status: AC
  Filled 2022-12-31: qty 2

## 2022-12-31 MED ORDER — OXYCODONE HCL 5 MG PO TABS
5.0000 mg | ORAL_TABLET | Freq: Once | ORAL | Status: AC | PRN
  Administered 2022-12-31: 5 mg via ORAL

## 2022-12-31 MED ORDER — ORAL CARE MOUTH RINSE: 15.0000 mL | Freq: Once | OROMUCOSAL | Status: AC

## 2022-12-31 MED ORDER — FENTANYL CITRATE (PF) 250 MCG/5ML IJ SOLN
INTRAMUSCULAR | Status: AC
  Filled 2022-12-31: qty 5

## 2022-12-31 MED ORDER — SILVER NITRATE-POT NITRATE 75-25 % EX MISC
CUTANEOUS | Status: AC
  Filled 2022-12-31: qty 10

## 2022-12-31 MED ORDER — FENTANYL CITRATE (PF) 100 MCG/2ML IJ SOLN: 25.0000 ug | INTRAMUSCULAR | Status: DC | PRN

## 2022-12-31 MED ORDER — LIDOCAINE HCL 1 % IJ SOLN
INTRAMUSCULAR | Status: AC
  Filled 2022-12-31: qty 20

## 2022-12-31 MED ORDER — DEXAMETHASONE SODIUM PHOSPHATE 4 MG/ML IJ SOLN
INTRAMUSCULAR | Status: DC | PRN
  Administered 2022-12-31: 10 mg via INTRAVENOUS

## 2022-12-31 MED ORDER — LIDOCAINE 2% (20 MG/ML) 5 ML SYRINGE
INTRAMUSCULAR | Status: DC | PRN
  Administered 2022-12-31: 100 mg via INTRAVENOUS

## 2022-12-31 MED ORDER — IBUPROFEN 600 MG PO TABS: 600.0000 mg | ORAL_TABLET | Freq: Four times a day (QID) | ORAL | 1 refills | Status: DC | PRN

## 2022-12-31 MED ORDER — LACTATED RINGERS IV SOLN: INTRAVENOUS | Status: DC

## 2022-12-31 MED ORDER — MUPIROCIN 2 % EX OINT
TOPICAL_OINTMENT | CUTANEOUS | Status: AC
  Filled 2022-12-31: qty 22

## 2022-12-31 MED ORDER — LIDOCAINE HCL (PF) 1 % IJ SOLN
INTRAMUSCULAR | Status: DC | PRN
  Administered 2022-12-31: 5.5 mL

## 2022-12-31 MED ORDER — KETOROLAC TROMETHAMINE 30 MG/ML IJ SOLN
INTRAMUSCULAR | Status: DC | PRN
  Administered 2022-12-31: 30 mg via INTRAVENOUS

## 2022-12-31 MED ORDER — OXYCODONE HCL 5 MG/5ML PO SOLN: 5.0000 mg | Freq: Once | ORAL | Status: AC | PRN

## 2022-12-31 MED ORDER — CHLORHEXIDINE GLUCONATE 0.12 % MT SOLN
15.0000 mL | Freq: Once | OROMUCOSAL | Status: AC
  Administered 2022-12-31: 15 mL via OROMUCOSAL
  Filled 2022-12-31: qty 15

## 2022-12-31 MED ORDER — PHENYLEPHRINE 80 MCG/ML (10ML) SYRINGE FOR IV PUSH (FOR BLOOD PRESSURE SUPPORT)
PREFILLED_SYRINGE | INTRAVENOUS | Status: AC
  Filled 2022-12-31: qty 10

## 2022-12-31 MED ORDER — LIDOCAINE 2% (20 MG/ML) 5 ML SYRINGE
INTRAMUSCULAR | Status: AC
  Filled 2022-12-31: qty 5

## 2022-12-31 MED ORDER — BACITRACIN ZINC 500 UNIT/GM EX OINT
TOPICAL_OINTMENT | CUTANEOUS | Status: DC | PRN
  Administered 2022-12-31: 1 via TOPICAL

## 2022-12-31 MED ORDER — ONDANSETRON HCL 4 MG/2ML IJ SOLN
INTRAMUSCULAR | Status: DC | PRN
  Administered 2022-12-31: 4 mg via INTRAVENOUS

## 2022-12-31 MED ORDER — OXYCODONE HCL 5 MG PO TABS
ORAL_TABLET | ORAL | Status: AC
  Filled 2022-12-31: qty 1

## 2022-12-31 MED ORDER — BACITRACIN ZINC 500 UNIT/GM EX OINT
TOPICAL_OINTMENT | CUTANEOUS | Status: AC
  Filled 2022-12-31: qty 28.35

## 2022-12-31 MED ORDER — DEXAMETHASONE SODIUM PHOSPHATE 10 MG/ML IJ SOLN
INTRAMUSCULAR | Status: AC
  Filled 2022-12-31: qty 1

## 2022-12-31 MED ORDER — PHENYLEPHRINE 80 MCG/ML (10ML) SYRINGE FOR IV PUSH (FOR BLOOD PRESSURE SUPPORT)
PREFILLED_SYRINGE | INTRAVENOUS | Status: DC | PRN
  Administered 2022-12-31: 80 ug via INTRAVENOUS

## 2022-12-31 SURGICAL SUPPLY — 12 items
CATH ROBINSON RED A/P 16FR (CATHETERS) IMPLANT
COVER BACK TABLE 60X90IN (DRAPES) ×1 IMPLANT
GAUZE SPONGE 2X2 8PLY STRL LF (GAUZE/BANDAGES/DRESSINGS) IMPLANT
GAUZE SPONGE 2X2 STRL 8-PLY (GAUZE/BANDAGES/DRESSINGS) IMPLANT
GLOVE BIO SURGEON STRL SZ7 (GLOVE) ×1 IMPLANT
GLOVE BIOGEL PI IND STRL 7.0 (GLOVE) ×2 IMPLANT
GLOVE SURG ENC MOIS LTX SZ7.5 (GLOVE) ×1 IMPLANT
GLOVE SURG UNDER POLY LF SZ7.5 (GLOVE) ×1 IMPLANT
GOWN STRL REUS W/ TWL LRG LVL3 (GOWN DISPOSABLE) ×2 IMPLANT
GOWN STRL REUS W/TWL LRG LVL3 (GOWN DISPOSABLE) ×2
KIT SUTURE REMOVAL HAMOT (SET/KITS/TRAYS/PACK) IMPLANT
SUT VIC AB 4-0 PS2 18 (SUTURE) IMPLANT

## 2022-12-31 NOTE — Transfer of Care (Signed)
Immediate Anesthesia Transfer of Care Note  Patient: Brandi Church  Procedure(s) Performed: LABIAPLASTY  Patient Location: PACU  Anesthesia Type:General  Level of Consciousness: awake and alert   Airway & Oxygen Therapy: Patient Spontanous Breathing and Patient connected to nasal cannula oxygen  Post-op Assessment: Report given to RN and Post -op Vital signs reviewed and stable  Post vital signs: Reviewed and stable  Last Vitals:  Vitals Value Taken Time  BP 102/69 12/31/22 0915  Temp    Pulse 98 12/31/22 0917  Resp 20 12/31/22 0917  SpO2 100 % 12/31/22 0917  Vitals shown include unvalidated device data.  Last Pain:  Vitals:   12/31/22 0619  TempSrc:   PainSc: 0-No pain         Complications: No notable events documented.

## 2022-12-31 NOTE — Op Note (Signed)
Preop Diagnosis: HYPERTROPHY OF LABIA  Postop Diagnosis: HYPERTROPHY OF LABIA  Procedure: RIGHT LABIAPLASTY   Anesthesia: General   Anesthesiologist:  See Flowsheet  Attending: Everett Graff, MD   Assistant: N/a  Findings: Rt labia protruding  Pathology: N/a  Fluids: 700 cc  UOP: 200 cc via straight cath  EBL: 15 cc  Complications: None  Procedure:  The patient was taken to the OR after risks benefits and alternatives reviewed with the patient including but not limited to bleeding infection injury.  The excess tissue on the right labia was excised trying to bury the incision on the inner part of the labia.  Silver nitrate applied with moderate hemostasis  A 4-0 virly subcuticular stitch was placed and pressure held.  Antib iotic ointment placed on incision and 2x2 with paper tape and an ice pack applied.  Patient tolerated the procedure well and was returned to the recovery room in good condition.

## 2022-12-31 NOTE — Anesthesia Postprocedure Evaluation (Signed)
Anesthesia Post Note  Patient: Brandi Church  Procedure(s) Performed: LABIAPLASTY     Patient location during evaluation: PACU Anesthesia Type: General Level of consciousness: awake and alert Pain management: pain level controlled Vital Signs Assessment: post-procedure vital signs reviewed and stable Respiratory status: spontaneous breathing, nonlabored ventilation, respiratory function stable and patient connected to nasal cannula oxygen Cardiovascular status: blood pressure returned to baseline and stable Postop Assessment: no apparent nausea or vomiting Anesthetic complications: no  No notable events documented.  Last Vitals:  Vitals:   12/31/22 0915 12/31/22 0930  BP: 102/69 116/77  Pulse: (!) 117 80  Resp: 15 11  Temp: 36.6 C   SpO2: 96% 100%    Last Pain:  Vitals:   12/31/22 0940  TempSrc:   PainSc: 6                  Henryetta Corriveau S

## 2022-12-31 NOTE — H&P (Signed)
Brandi Church is an 22 y.o. female. Pt known to me desiring repair of right labia.  Pertinent Gynecological History: G1P1  Menstrual History:  Patient's last menstrual period was 12/08/2022 (exact date).    Past Medical History:  Diagnosis Date   ADHD (attention deficit hyperactivity disorder)    Anemia    Asthma    Complication of anesthesia    Depression    GAD (generalized anxiety disorder)    Headache disorder 06/22/2020   History of RSV infection 10/20/2022   ED visit in epic positive test and dx viral tonsillitis   History of tonsillitis 11/24/2022   (12-25-2022 per pt symptoms completely resolved)  ED visit in epic, can't breath or swallow due to enlarged tonsill's , dx acute pharyngitis;   in epic per  PCP note 11-26-2022  dx tonsillitis w/ exudate , given prednisone pack   MDD (major depressive disorder)    12/28/22   PONV (postoperative nausea and vomiting)     Past Surgical History:  Procedure Laterality Date   NO PAST SURGERIES     WISDOM TOOTH EXTRACTION     removed on 08/16/2022.  11/28/2021    Family History  Problem Relation Age of Onset   Depression Mother    Asthma Mother    Depression Father    Asthma Father    Hyperlipidemia Maternal Grandmother    Heart disease Maternal Grandmother    Hypertension Maternal Grandmother    Hyperlipidemia Maternal Grandfather    Heart disease Maternal Grandfather    COPD Maternal Grandfather    Hypertension Maternal Grandfather    Diabetes Maternal Grandfather     Social History:  reports that she has never smoked. She has never been exposed to tobacco smoke. She has never used smokeless tobacco. She reports that she does not drink alcohol and does not use drugs.  Allergies:  Allergies  Allergen Reactions   Latex Itching and Swelling    Reports swelling of mouth and itching    Medications Prior to Admission  Medication Sig Dispense Refill Last Dose   etonogestrel-ethinyl estradiol (NUVARING) 0.12-0.015  MG/24HR vaginal ring Place 1 each vaginally every 28 (twenty-eight) days.      LORazepam (ATIVAN) 1 MG tablet Take 1 mg by mouth 2 (two) times daily as needed for anxiety.   12/31/2022   traZODone (DESYREL) 50 MG tablet TAKE 1/2 TO 1 TABLET (25 TO 50 MG TOTAL) BY MOUTH AT BEDTIME (Patient taking differently: Take 50 mg by mouth at bedtime as needed for sleep.) 90 tablet 2 Past Month   fluticasone (FLOVENT HFA) 110 MCG/ACT inhaler Inhale 2 puffs into the lungs daily as needed (asthma).   More than a month   sertraline (ZOLOFT) 100 MG tablet Take 100 mg by mouth daily.   More than a month   VENTOLIN HFA 108 (90 Base) MCG/ACT inhaler Inhale 2 puffs into the lungs every 4 (four) hours as needed for shortness of breath.   More than a month    Review of Systems Denies F/C/N/V/D  Blood pressure 110/71, pulse 76, temperature 98 F (36.7 C), temperature source Oral, resp. rate 20, height '5\' 4"'$  (1.626 m), weight 59 kg, last menstrual period 12/08/2022, SpO2 100 %, not currently breastfeeding. Physical Exam Lungs unlabored breathing CV RRR Abdomen soft, NT Extremities  Results for orders placed or performed during the hospital encounter of 12/31/22 (from the past 24 hour(s))  Pregnancy, urine POC     Status: None   Collection Time: 12/31/22  6:27  AM  Result Value Ref Range   Preg Test, Ur NEGATIVE NEGATIVE    No results found.  Assessment/Plan: 22yo G1P1 presenting for labiaplasty d/t asymmetric labia with the right protruding causing irritation.  Risks benefits alternatives reviewed with the patient including but not limited to bleeding infection and injury.  Questions answered and consent signed and witnessed.  Delice Lesch 12/31/2022, 7:47 AM

## 2022-12-31 NOTE — Anesthesia Preprocedure Evaluation (Signed)
Anesthesia Evaluation  Patient identified by MRN, date of birth, ID band Patient awake    Reviewed: Allergy & Precautions, H&P , NPO status , Patient's Chart, lab work & pertinent test results  History of Anesthesia Complications (+) PONV and history of anesthetic complications  Airway Mallampati: II  TM Distance: >3 FB Neck ROM: Full    Dental no notable dental hx.    Pulmonary neg pulmonary ROS   Pulmonary exam normal breath sounds clear to auscultation       Cardiovascular negative cardio ROS Normal cardiovascular exam Rhythm:Regular Rate:Normal     Neuro/Psych   Anxiety Depression    negative neurological ROS     GI/Hepatic negative GI ROS, Neg liver ROS,,,  Endo/Other  negative endocrine ROS    Renal/GU negative Renal ROS  negative genitourinary   Musculoskeletal negative musculoskeletal ROS (+)    Abdominal   Peds negative pediatric ROS (+)  Hematology negative hematology ROS (+)   Anesthesia Other Findings   Reproductive/Obstetrics negative OB ROS                             Anesthesia Physical Anesthesia Plan  ASA: 2  Anesthesia Plan: General   Post-op Pain Management: Minimal or no pain anticipated   Induction: Intravenous  PONV Risk Score and Plan: 4 or greater and Ondansetron, Dexamethasone, Midazolam and Treatment may vary due to age or medical condition  Airway Management Planned: LMA  Additional Equipment:   Intra-op Plan:   Post-operative Plan: Extubation in OR  Informed Consent: I have reviewed the patients History and Physical, chart, labs and discussed the procedure including the risks, benefits and alternatives for the proposed anesthesia with the patient or authorized representative who has indicated his/her understanding and acceptance.     Dental advisory given  Plan Discussed with: CRNA and Surgeon  Anesthesia Plan Comments:         Anesthesia Quick Evaluation

## 2022-12-31 NOTE — Anesthesia Procedure Notes (Signed)
Procedure Name: LMA Insertion Date/Time: 12/31/2022 8:04 AM  Performed by: Lieutenant Diego, CRNAPre-anesthesia Checklist: Patient identified, Emergency Drugs available, Suction available and Patient being monitored Patient Re-evaluated:Patient Re-evaluated prior to induction Oxygen Delivery Method: Circle system utilized Preoxygenation: Pre-oxygenation with 100% oxygen Induction Type: IV induction Ventilation: Mask ventilation without difficulty LMA: LMA inserted LMA Size: 4.0 Number of attempts: 1 Placement Confirmation: positive ETCO2 and breath sounds checked- equal and bilateral Tube secured with: Tape Dental Injury: Teeth and Oropharynx as per pre-operative assessment

## 2023-01-01 ENCOUNTER — Encounter (HOSPITAL_COMMUNITY): Payer: Self-pay | Admitting: Obstetrics and Gynecology

## 2023-01-04 DIAGNOSIS — Z419 Encounter for procedure for purposes other than remedying health state, unspecified: Secondary | ICD-10-CM | POA: Diagnosis not present

## 2023-01-11 ENCOUNTER — Telehealth: Payer: Medicaid Other

## 2023-01-11 ENCOUNTER — Encounter: Payer: Self-pay | Admitting: Nurse Practitioner

## 2023-01-11 ENCOUNTER — Telehealth: Payer: Medicaid Other | Admitting: Physician Assistant

## 2023-01-11 DIAGNOSIS — B3731 Acute candidiasis of vulva and vagina: Secondary | ICD-10-CM

## 2023-01-11 NOTE — Progress Notes (Signed)
Patient out of town 

## 2023-01-15 DIAGNOSIS — J3501 Chronic tonsillitis: Secondary | ICD-10-CM | POA: Diagnosis not present

## 2023-01-15 DIAGNOSIS — J351 Hypertrophy of tonsils: Secondary | ICD-10-CM | POA: Diagnosis not present

## 2023-01-26 ENCOUNTER — Telehealth: Payer: Medicaid Other | Admitting: Family

## 2023-01-26 DIAGNOSIS — R399 Unspecified symptoms and signs involving the genitourinary system: Secondary | ICD-10-CM

## 2023-01-27 MED ORDER — CEPHALEXIN 500 MG PO CAPS: 500.0000 mg | ORAL_CAPSULE | Freq: Two times a day (BID) | ORAL | 0 refills | Status: DC

## 2023-01-27 NOTE — Progress Notes (Signed)

## 2023-02-04 DIAGNOSIS — Z419 Encounter for procedure for purposes other than remedying health state, unspecified: Secondary | ICD-10-CM | POA: Diagnosis not present

## 2023-02-05 DIAGNOSIS — Z113 Encounter for screening for infections with a predominantly sexual mode of transmission: Secondary | ICD-10-CM | POA: Diagnosis not present

## 2023-02-05 DIAGNOSIS — Z114 Encounter for screening for human immunodeficiency virus [HIV]: Secondary | ICD-10-CM | POA: Diagnosis not present

## 2023-02-19 DIAGNOSIS — N898 Other specified noninflammatory disorders of vagina: Secondary | ICD-10-CM | POA: Diagnosis not present

## 2023-02-21 ENCOUNTER — Telehealth: Payer: Medicaid Other | Admitting: Physician Assistant

## 2023-02-21 DIAGNOSIS — B3731 Acute candidiasis of vulva and vagina: Secondary | ICD-10-CM

## 2023-02-21 MED ORDER — FLUCONAZOLE 150 MG PO TABS: 150.0000 mg | ORAL_TABLET | Freq: Once | ORAL | 0 refills | Status: AC

## 2023-02-21 NOTE — Progress Notes (Signed)

## 2023-03-06 DIAGNOSIS — Z419 Encounter for procedure for purposes other than remedying health state, unspecified: Secondary | ICD-10-CM | POA: Diagnosis not present

## 2023-03-27 DIAGNOSIS — N898 Other specified noninflammatory disorders of vagina: Secondary | ICD-10-CM | POA: Diagnosis not present

## 2023-03-27 DIAGNOSIS — Z113 Encounter for screening for infections with a predominantly sexual mode of transmission: Secondary | ICD-10-CM | POA: Diagnosis not present

## 2023-04-02 DIAGNOSIS — Z111 Encounter for screening for respiratory tuberculosis: Secondary | ICD-10-CM | POA: Diagnosis not present

## 2023-04-06 DIAGNOSIS — Z419 Encounter for procedure for purposes other than remedying health state, unspecified: Secondary | ICD-10-CM | POA: Diagnosis not present

## 2023-04-18 DIAGNOSIS — Z0000 Encounter for general adult medical examination without abnormal findings: Secondary | ICD-10-CM | POA: Diagnosis not present

## 2023-04-18 DIAGNOSIS — Z309 Encounter for contraceptive management, unspecified: Secondary | ICD-10-CM | POA: Diagnosis not present

## 2023-04-18 DIAGNOSIS — Z113 Encounter for screening for infections with a predominantly sexual mode of transmission: Secondary | ICD-10-CM | POA: Diagnosis not present

## 2023-04-18 DIAGNOSIS — Z01419 Encounter for gynecological examination (general) (routine) without abnormal findings: Secondary | ICD-10-CM | POA: Diagnosis not present

## 2023-04-18 DIAGNOSIS — K5900 Constipation, unspecified: Secondary | ICD-10-CM | POA: Diagnosis not present

## 2023-05-02 DIAGNOSIS — H1031 Unspecified acute conjunctivitis, right eye: Secondary | ICD-10-CM | POA: Diagnosis not present

## 2023-05-06 DIAGNOSIS — Z419 Encounter for procedure for purposes other than remedying health state, unspecified: Secondary | ICD-10-CM | POA: Diagnosis not present

## 2023-06-06 DIAGNOSIS — Z419 Encounter for procedure for purposes other than remedying health state, unspecified: Secondary | ICD-10-CM | POA: Diagnosis not present

## 2023-06-28 ENCOUNTER — Encounter: Payer: Medicaid Other | Admitting: Family

## 2023-06-28 NOTE — Progress Notes (Deleted)
Phone [phone]  Subjective:   Patient is a 22 y.o. female presenting for annual physical.    No chief complaint on file.   See problem oriented charting- ROS- full  review of systems was completed and negative except for *** noted in HPI above.  The following were reviewed and entered/updated in epic: Past Medical History:  Diagnosis Date   ADHD (attention deficit hyperactivity disorder)    Anemia    Asthma    Complication of anesthesia    Depression    GAD (generalized anxiety disorder)    Headache disorder 06/22/2020   History of RSV infection 10/20/2022   ED visit in epic positive test and dx viral tonsillitis   History of tonsillitis 11/24/2022   (12-25-2022 per pt symptoms completely resolved)  ED visit in epic, can't breath or swallow due to enlarged tonsill's , dx acute pharyngitis;   in epic per  PCP note 11-26-2022  dx tonsillitis w/ exudate , given prednisone pack   MDD (major depressive disorder)    12/28/22   PONV (postoperative nausea and vomiting)    Patient Active Problem List   Diagnosis Date Noted   Unintended weight loss 10/08/2022   Supervision of normal first pregnancy 08/16/2021   MDD (major depressive disorder), recurrent episode, moderate (HCC) 01/19/2021   Attention deficit hyperactivity disorder (ADHD), predominantly inattentive type 01/19/2021   Generalized anxiety disorder with panic attacks 12/09/2020   Asthma 05/26/2019   Past Surgical History:  Procedure Laterality Date   LABIOPLASTY N/A 12/31/2022   Procedure: LABIAPLASTY;  Surgeon: Osborn Coho, MD;  Location: [facility] OR;  Service: Gynecology;  Laterality: N/A;   NO PAST SURGERIES     WISDOM TOOTH EXTRACTION     removed on 08/16/2022.  11/28/2021    Family History  Problem Relation Age of Onset   Depression Mother    Asthma Mother    Depression Father    Asthma Father    Hyperlipidemia Maternal Grandmother    Heart disease Maternal Grandmother    Hypertension Maternal  Grandmother    Hyperlipidemia Maternal Grandfather    Heart disease Maternal Grandfather    COPD Maternal Grandfather    Hypertension Maternal Grandfather    Diabetes Maternal Grandfather     Medications- reviewed and updated Current Outpatient Medications  Medication Sig Dispense Refill   etonogestrel-ethinyl estradiol (NUVARING) 0.12-0.015 MG/24HR vaginal ring Place 1 each vaginally every 28 (twenty-eight) days.     fluticasone (FLOVENT HFA) 110 MCG/ACT inhaler Inhale 2 puffs into the lungs daily as needed (asthma).     ibuprofen (ADVIL) 600 MG tablet Take 1 tablet (600 mg total) by mouth every 6 (six) hours as needed. 40 tablet 1   lidocaine (XYLOCAINE) 5 % ointment Apply 1 Application topically 4 (four) times daily as needed. 10 g 0   LORazepam (ATIVAN) 1 MG tablet Take 1 mg by mouth 2 (two) times daily as needed for anxiety.     oxyCODONE (ROXICODONE) 5 MG immediate release tablet Take 1 tablet (5 mg total) by mouth every 6 (six) hours as needed for severe pain. 10 tablet 0   sertraline (ZOLOFT) 100 MG tablet Take 100 mg by mouth daily.     traZODone (DESYREL) 50 MG tablet TAKE 1/2 TO 1 TABLET (25 TO 50 MG TOTAL) BY MOUTH AT BEDTIME (Patient taking differently: Take 50 mg by mouth at bedtime as needed for sleep.) 90 tablet 2   VENTOLIN HFA 108 (90 Base) MCG/ACT inhaler Inhale 2 puffs into the lungs every  4 (four) hours as needed for shortness of breath.     No current facility-administered medications for this visit.    Allergies-reviewed and updated Allergies  Allergen Reactions   Latex Itching and Swelling    Reports swelling of mouth and itching    Social History   Social History Narrative   Not on file    Objective:  There were no vitals taken for this visit. Physical Exam Vitals and nursing note reviewed.  Constitutional:      Appearance: Normal appearance.  HENT:     Head: Normocephalic.     Right Ear: Tympanic membrane normal.     Left Ear: Tympanic membrane  normal.     Nose: Nose normal.     Mouth/Throat:     Mouth: Mucous membranes are moist.  Eyes:     Pupils: Pupils are equal, round, and reactive to light.  Cardiovascular:     Rate and Rhythm: Normal rate and regular rhythm.  Pulmonary:     Effort: Pulmonary effort is normal.     Breath sounds: Normal breath sounds.  Musculoskeletal:        General: Normal range of motion.     Cervical back: Normal range of motion.  Lymphadenopathy:     Cervical: No cervical adenopathy.  Skin:    General: Skin is warm and dry.  Neurological:     Mental Status: She is alert.  Psychiatric:        Mood and Affect: Mood normal.        Behavior: Behavior normal.       Assessment and Plan   Health Maintenance counseling: 1. Anticipatory guidance: Patient counseled regarding regular dental exams q6 months, eye exams,  avoiding smoking and second hand smoke, limiting alcohol to 1 beverage per day, no illicit drugs.   2. Risk factor reduction:  Advised patient of need for regular exercise and diet rich with fruits and vegetables to reduce risk of heart attack and stroke. Wt Readings from Last 3 Encounters:  12/31/22 130 lb (59 kg) (54%, Z= 0.11)*  11/26/22 130 lb (59 kg) (55%, Z= 0.12)*  10/20/22 133 lb 9.6 oz (60.6 kg) (61%, Z= 0.29)*   * Growth percentiles are based on CDC (Girls, 2-20 Years) data.   3. Immunizations/screenings/ancillary studies Immunization History  Administered Date(s) Administered   Influenza,inj,Quad PF,6+ Mos 10/11/2017, 07/23/2019, 07/18/2022, 08/23/2022   Influenza-Unspecified 08/14/2021   PFIZER(Purple Top)SARS-COV-2 Vaccination 02/16/2020, 03/08/2020, 10/21/2020   Tdap 12/20/2021   Health Maintenance Due  Topic Date Due   HPV VACCINES (1 - 3-dose series) Never done   INFLUENZA VACCINE  06/06/2023    4. Cervical cancer screening: *** 5. Skin cancer screening- advised regular sunscreen use. Denies worrisome, changing, or new skin lesions.  6. Birth control/STD  check: *** 7. Smoking associated screening: *** smoker 8. Alcohol screening: ***  There are no diagnoses linked to this encounter.  Recommended follow up: ***No follow-ups on file. Future Appointments  Date Time Provider Department Center  06/28/2023  8:00 AM Dulce Sellar, NP [facility]    Lab/Order associations:fasting    Dulce Sellar, NP

## 2023-07-07 DIAGNOSIS — Z419 Encounter for procedure for purposes other than remedying health state, unspecified: Secondary | ICD-10-CM | POA: Diagnosis not present

## 2023-07-12 ENCOUNTER — Ambulatory Visit (INDEPENDENT_AMBULATORY_CARE_PROVIDER_SITE_OTHER): Payer: Medicaid Other | Admitting: Family

## 2023-07-12 ENCOUNTER — Encounter: Payer: Self-pay | Admitting: Family

## 2023-07-12 VITALS — BP 103/67 | HR 102 | Temp 98.0°F | Ht 64.0 in | Wt 128.0 lb

## 2023-07-12 DIAGNOSIS — F32A Depression, unspecified: Secondary | ICD-10-CM

## 2023-07-12 DIAGNOSIS — Z23 Encounter for immunization: Secondary | ICD-10-CM

## 2023-07-12 DIAGNOSIS — Z0000 Encounter for general adult medical examination without abnormal findings: Secondary | ICD-10-CM | POA: Diagnosis not present

## 2023-07-12 DIAGNOSIS — F419 Anxiety disorder, unspecified: Secondary | ICD-10-CM | POA: Insufficient documentation

## 2023-07-12 LAB — CBC WITH DIFFERENTIAL/PLATELET
Basophils Absolute: 0 10*3/uL (ref 0.0–0.1)
Basophils Relative: 0.7 % (ref 0.0–3.0)
Eosinophils Absolute: 0 10*3/uL (ref 0.0–0.7)
Eosinophils Relative: 1.1 % (ref 0.0–5.0)
HCT: 36.1 % (ref 36.0–46.0)
Hemoglobin: 11 g/dL — ABNORMAL LOW (ref 12.0–15.0)
Lymphocytes Relative: 41.7 % (ref 12.0–46.0)
Lymphs Abs: 1.7 10*3/uL (ref 0.7–4.0)
MCHC: 30.5 g/dL (ref 30.0–36.0)
MCV: 76 fl — ABNORMAL LOW (ref 78.0–100.0)
Monocytes Absolute: 0.3 10*3/uL (ref 0.1–1.0)
Monocytes Relative: 7.9 % (ref 3.0–12.0)
Neutro Abs: 1.9 10*3/uL (ref 1.4–7.7)
Neutrophils Relative %: 48.6 % (ref 43.0–77.0)
Platelets: 302 10*3/uL (ref 150.0–400.0)
RBC: 4.76 Mil/uL (ref 3.87–5.11)
RDW: 15 % — ABNORMAL HIGH (ref 11.5–14.6)
WBC: 4 10*3/uL — ABNORMAL LOW (ref 4.5–10.5)

## 2023-07-12 LAB — LIPID PANEL
Cholesterol: 177 mg/dL (ref 0–200)
HDL: 72.8 mg/dL (ref >39.00)
LDL Cholesterol: 92 mg/dL (ref 0–99)
NonHDL: 104.43
Total CHOL/HDL Ratio: 2
Triglycerides: 61 mg/dL (ref 0.0–149.0)
VLDL: 12.2 mg/dL (ref 0.0–40.0)

## 2023-07-12 LAB — COMPREHENSIVE METABOLIC PANEL
ALT: 11 U/L (ref 0–35)
AST: 19 U/L (ref 0–37)
Albumin: 3.8 g/dL (ref 3.5–5.2)
Alkaline Phosphatase: 41 U/L (ref 39–117)
BUN: 9 mg/dL (ref 6–23)
CO2: 26 meq/L (ref 19–32)
Calcium: 9.1 mg/dL (ref 8.4–10.5)
Chloride: 106 meq/L (ref 96–112)
Creatinine, Ser: 0.69 mg/dL (ref 0.40–1.20)
GFR: 125.23 mL/min (ref >60.00)
Glucose, Bld: 74 mg/dL (ref 70–99)
Potassium: 3.6 meq/L (ref 3.5–5.1)
Sodium: 139 meq/L (ref 135–145)
Total Bilirubin: 0.3 mg/dL (ref 0.2–1.2)
Total Protein: 7.4 g/dL (ref 6.0–8.3)

## 2023-07-12 LAB — TSH: TSH: 1.22 u[IU]/mL (ref 0.35–5.50)

## 2023-07-12 MED ORDER — SERTRALINE HCL 100 MG PO TABS: 100.0000 mg | ORAL_TABLET | Freq: Every day | ORAL | 5 refills | Status: DC

## 2023-07-12 MED ORDER — LORAZEPAM 1 MG PO TABS: 1.0000 mg | ORAL_TABLET | Freq: Every day | ORAL | 0 refills | Status: DC | PRN

## 2023-07-12 NOTE — Assessment & Plan Note (Addendum)
chronic Taking zoloft 100mg , but has been off for awhile, would like to restart 50mg  first, has Hydroxyzine 10mg  prn & Lorazepam for panic attacks, dental visits needs new Psych referral sending Zoloft refill, advise to cut pills in half for 1-2 w, then increase, sending #10 pills only of Lorazepam, reminded pt of addiction potential f/u 6 mos or prn

## 2023-07-12 NOTE — Assessment & Plan Note (Signed)
>>  ASSESSMENT AND PLAN FOR ANXIETY AND DEPRESSION WRITTEN ON 07/12/2023 12:28 PM BY HUDNELL, STEPHANIE, NP  chronic Taking zoloft 100mg , but has been off for awhile, would like to restart 50mg  first, has Hydroxyzine 10mg  prn & Lorazepam for panic attacks, dental visits needs new Psych referral sending Zoloft refill, advise to cut pills in half for 1-2 w, then increase, sending #10 pills only of Lorazepam, reminded pt of addiction potential f/u 6 mos or prn

## 2023-07-12 NOTE — Progress Notes (Signed)
Phone [phone]  Subjective:   Patient is a 22 y.o. female presenting for annual physical.    Chief Complaint  Patient presents with   Annual Exam    Non Fasting w/ labs   Anxiety   HPI: Anxiety and depression:  started on Zoloft in 2022 & Hydroxyzine 10mg  prn, also was prescribed by Psych & also her Vyvanse; then her OB took over after her baby was born, pt states her Psych NP provider left the practice. OB also prescribed Ativan to help pt not worry about her baby overnight & for panic attacks r/t to being a young first time mom, & uses for dental visits, has not taken in long time, just likes to have on hand.  See problem oriented charting- ROS- full  review of systems was completed and negative except for anxiety and depression noted in HPI above.  The following were reviewed and entered/updated in epic: Past Medical History:  Diagnosis Date   ADHD (attention deficit hyperactivity disorder)    Anemia    Asthma    Complication of anesthesia    Depression    GAD (generalized anxiety disorder)    Headache disorder 06/22/2020   History of RSV infection 10/20/2022   ED visit in epic positive test and dx viral tonsillitis   History of tonsillitis 11/24/2022   (12-25-2022 per pt symptoms completely resolved)  ED visit in epic, can't breath or swallow due to enlarged tonsill's , dx acute pharyngitis;   in epic per  PCP note 11-26-2022  dx tonsillitis w/ exudate , given prednisone pack   MDD (major depressive disorder)    12/28/22   PONV (postoperative nausea and vomiting)    Supervision of normal first pregnancy 08/16/2021              Nursing Staff    Provider      Office Location     [facility]    Dating     LMP      Language      ENGLISH    Anatomy US     [facility], resolved on follow-up      Flu Vaccine     08/14/2021    Genetic/Carrier Screen     NIPS: low risk  AFP:   Screen neg  Horizon: negative      TDaP Vaccine      12/20/2021    Hgb A1C or   GTT    Early   Third trimester  [id number]      COVID Vaccine     YES         L   Unintended weight loss 10/08/2022   Patient Active Problem List   Diagnosis Date Noted   Anxiety and depression 07/12/2023   MDD (major depressive disorder), recurrent episode, moderate (HCC) 01/19/2021   Attention deficit hyperactivity disorder (ADHD), predominantly inattentive type 01/19/2021   Generalized anxiety disorder with panic attacks 12/09/2020   Asthma 05/26/2019   Past Surgical History:  Procedure Laterality Date   LABIOPLASTY N/A 12/31/2022   Procedure: LABIAPLASTY;  Surgeon: Osborn Coho, MD;  Location: [facility];  Service: Gynecology;  Laterality: N/A;   NO PAST SURGERIES     WISDOM TOOTH EXTRACTION     removed on 08/16/2022.  11/28/2021    Family History  Problem Relation Age of Onset   Depression Mother    Asthma Mother    Depression Father    Asthma Father    Hyperlipidemia Maternal Grandmother    Heart disease  Maternal Grandmother    Hypertension Maternal Grandmother    Hyperlipidemia Maternal Grandfather    Heart disease Maternal Grandfather    COPD Maternal Grandfather    Hypertension Maternal Grandfather    Diabetes Maternal Grandfather    Medications- reviewed and updated Current Outpatient Medications  Medication Sig Dispense Refill   etonogestrel-ethinyl estradiol (NUVARING) 0.12-0.015 MG/24HR vaginal ring Place 1 each vaginally every 28 (twenty-eight) days.     fluticasone (FLOVENT HFA) 110 MCG/ACT inhaler Inhale 2 puffs into the lungs daily as needed (asthma).     VENTOLIN HFA 108 (90 Base) MCG/ACT inhaler Inhale 2 puffs into the lungs every 4 (four) hours as needed for shortness of breath.     LORazepam (ATIVAN) 1 MG tablet Take 1 tablet (1 mg total) by mouth daily as needed for anxiety or sedation. 10 tablet 0   sertraline (ZOLOFT) 100 MG tablet Take 1 tablet (100 mg total) by mouth daily. Start with 1/2 tab for 1-2 weeks, then increase to 1 full tab daily. 30 tablet 5   No current  facility-administered medications for this visit.    Allergies-reviewed and updated Allergies  Allergen Reactions   Latex Itching and Swelling    Reports swelling of mouth and itching    Social History   Social History Narrative   Not on file    Objective:  BP 103/67   Pulse (!) 102   Temp 98 F (36.7 C) (Temporal)   Ht 5\' 4"  (1.626 m)   Wt 128 lb (58.1 kg)   LMP 05/25/2023 (Exact Date)   SpO2 99%   Breastfeeding No   BMI 21.97 kg/m  Physical Exam Vitals and nursing note reviewed.  Constitutional:      Appearance: Normal appearance.  HENT:     Head: Normocephalic.     Right Ear: Tympanic membrane normal.     Left Ear: Tympanic membrane normal.     Nose: Nose normal.     Mouth/Throat:     Mouth: Mucous membranes are moist.  Eyes:     Pupils: Pupils are equal, round, and reactive to light.  Cardiovascular:     Rate and Rhythm: Normal rate and regular rhythm.  Pulmonary:     Effort: Pulmonary effort is normal.     Breath sounds: Normal breath sounds.  Musculoskeletal:        General: Normal range of motion.     Cervical back: Normal range of motion.  Lymphadenopathy:     Cervical: No cervical adenopathy.  Skin:    General: Skin is warm and dry.  Neurological:     Mental Status: She is alert.  Psychiatric:        Mood and Affect: Mood normal.        Behavior: Behavior normal.      Assessment and Plan   Health Maintenance counseling: 1. Anticipatory guidance: Patient counseled regarding regular dental exams q6 months, eye exams,  avoiding smoking and second hand smoke, limiting alcohol to 1 beverage per day, no illicit drugs.   2. Risk factor reduction:  Advised patient of need for regular exercise and diet rich with fruits and vegetables to reduce risk of heart attack and stroke. Wt Readings from Last 3 Encounters:  07/12/23 128 lb (58.1 kg)  12/31/22 130 lb (59 kg) (54%, Z= 0.11)*  11/26/22 130 lb (59 kg) (55%, Z= 0.12)*   * Growth percentiles are  based on CDC (Girls, 2-20 Years) data.   3. Immunizations/screenings/ancillary studies Immunization History  Administered  Date(s) Administered   Influenza,inj,Quad PF,6+ Mos 10/11/2017, 07/23/2019, 07/18/2022, 08/23/2022, 07/12/2023   Influenza-Unspecified 08/14/2021   PFIZER(Purple Top)SARS-COV-2 Vaccination 02/16/2020, 03/08/2020, 10/21/2020   Tdap 12/20/2021   Health Maintenance Due  Topic Date Due   HPV VACCINES (1 - 3-dose series) Never done    4. Cervical cancer screening: done in 2024 5. Skin cancer screening- advised regular sunscreen use. Denies worrisome, changing, or new skin lesions.  6. Birth control/STD check: vaginal ring  7. Smoking associated screening: non- smoker 8. Alcohol screening: none  Annual physical exam -     CBC with Differential/Platelet -     Comprehensive metabolic panel -     Lipid panel -     TSH  Anxiety and depression Assessment & Plan: chronic Taking zoloft 100mg , but has been off for awhile, would like to restart 50mg  first, has Hydroxyzine 10mg  prn & Lorazepam for panic attacks, dental visits needs new Psych referral sending Zoloft refill, advise to cut pills in half for 1-2 w, then increase, sending #10 pills only of Lorazepam, reminded pt of addiction potential f/u 6 mos or prn  Orders: -     Sertraline HCl; Take 1 tablet (100 mg total) by mouth daily. Start with 1/2 tab for 1-2 weeks, then increase to 1 full tab daily.  Dispense: 30 tablet; Refill: 5 -     LORazepam; Take 1 tablet (1 mg total) by mouth daily as needed for anxiety or sedation.  Dispense: 10 tablet; Refill: 0 -     Ambulatory referral to Psychiatry  Immunization due -     Flu Vaccine QUAD 54mo+IM (Fluarix, Fluzone & Alfiuria Quad PF)   Recommended follow up:  Return in about 1 week (around 07/19/2023) for ADHD f/u. No future appointments.   Lab/Order associations:fasting    Dulce Sellar, NP

## 2023-07-12 NOTE — Patient Instructions (Addendum)
It was very nice to see you today!    I will review your lab results via MyChart in a few days.  I have sent in your medication refills. I have sent a new referral to Psychiatry. But they ask that you call their office to schedule your first appointment.  [facility] at [phone].  Have a great weekend!     PLEASE NOTE:  If you had any lab tests please let us know if you have not heard back within a few days. You may see your results on MyChart before we have a chance to review them but we will give you a call once they are reviewed by [place]. If we ordered any referrals today, please let us know if you have not heard from their office within the next week.

## 2023-07-15 DIAGNOSIS — R002 Palpitations: Secondary | ICD-10-CM | POA: Diagnosis not present

## 2023-07-15 DIAGNOSIS — T43225A Adverse effect of selective serotonin reuptake inhibitors, initial encounter: Secondary | ICD-10-CM | POA: Diagnosis not present

## 2023-07-19 NOTE — Addendum Note (Signed)
Addended byZoe Lan on: 07/19/2023 02:05 PM   Modules accepted: Orders

## 2023-07-23 DIAGNOSIS — N926 Irregular menstruation, unspecified: Secondary | ICD-10-CM | POA: Diagnosis not present

## 2023-07-23 DIAGNOSIS — Z3202 Encounter for pregnancy test, result negative: Secondary | ICD-10-CM | POA: Diagnosis not present

## 2023-07-23 DIAGNOSIS — Z7251 High risk heterosexual behavior: Secondary | ICD-10-CM | POA: Diagnosis not present

## 2023-07-26 ENCOUNTER — Ambulatory Visit (INDEPENDENT_AMBULATORY_CARE_PROVIDER_SITE_OTHER): Payer: Medicaid Other | Admitting: Student

## 2023-07-26 ENCOUNTER — Encounter (HOSPITAL_COMMUNITY): Payer: Self-pay | Admitting: Student

## 2023-07-26 DIAGNOSIS — F411 Generalized anxiety disorder: Secondary | ICD-10-CM

## 2023-07-26 DIAGNOSIS — Z9152 Personal history of nonsuicidal self-harm: Secondary | ICD-10-CM | POA: Diagnosis not present

## 2023-07-26 DIAGNOSIS — F410 Panic disorder [episodic paroxysmal anxiety] without agoraphobia: Secondary | ICD-10-CM | POA: Diagnosis not present

## 2023-07-26 HISTORY — DX: Personal history of nonsuicidal self-harm: Z91.52

## 2023-07-26 MED ORDER — ESCITALOPRAM OXALATE 10 MG PO TABS: 10.0000 mg | ORAL_TABLET | Freq: Every day | ORAL | 1 refills | Status: DC

## 2023-07-26 MED ORDER — HYDROXYZINE HCL 25 MG PO TABS: ORAL_TABLET | ORAL | 0 refills | Status: AC

## 2023-07-26 NOTE — Progress Notes (Signed)
BH MD Outpatient Progress Note  07/26/2023 9:33 AM Brandi Church  MRN: [id number]  Assessment:  Brandi Church presents for follow-up evaluation in-person.  Identifying Information: Brandi Church is a 22 y.o. 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   Risk Assessment: An assessment of suicide and violence risk factors was performed as part of this evaluation and is not significantly changed from the last visit.             While future psychiatric events cannot be accurately predicted, the patient does not currently require acute inpatient psychiatric care and does not currently meet [facility] involuntary commitment criteria.          Plan:  Does not meet criteria for MDD at this time (5/9 sxs)  # GAD w panic attacks Past medication trials:  Status of problem:  Sxs - excessive worry about multiple aspect of life, panic attacks 2-3x/week since 03/2022 after daughter's birth. GAD present before pregnancy and birth. Her description of sudden panic attacks in the middle of the night sound possibly cardiac to me, recommended that she see PCP for cardiac workup/monitoring in addition to some labs per below.  Most recently on Zoloft, stopped due to unable to tolerate activating and GI side effects, for this reason opted for Lexapro due to less side effects.  She received Ativan as needed from her PCP, which she uses appropriately as needed rarely. Interventions: STARTED Lexapro 10 mg daily (s9/20/2024) STARTED hydroxyzine 25 mg PRN daily Ativan 1 mg as needed x 10 tablets per PCP (07/12/2023) Recommended cardiac workup with PCP Labs: Vitamin D, B12, folate, iron panel - coordinated with PCP TSH WNL (07/12/2023)  # ADHD Past medication trials:  Status of problem:  No formal neuropsych testing, dx by peds  at 22yo.  Does note that stimulants worsen her anxiety, considering nonstimulant such a Strattera or Wellbutrin in the future to replace Vyvanse. Interventions: Vyvanse 20 mg daily per PCP  Health Maintenance PCP: Dulce Sellar, NP @ [place]   - Hb 11.0 (07/12/2023) - Labs per above    Return to care in: Future Appointments  Date Time Provider Department Center  08/29/2023 10:00 AM Princess Bruins, DO [facility] None    Patient was given contact information for behavioral health clinic and was instructed to call 911 for emergencies.    Patient and plan of care will be discussed with the Attending MD, Dr. Lucianne Muss, who agrees with the above statement and plan.   Subjective:  Chief Complaint:  Chief Complaint  Patient presents with   Anxiety    Interval History:   Presented unaccompanied.  Has been about a year since she was last seen at this office, stated she is to restart medications for anxiety/depression.  Previous rx:  Sertraline 50 mg -unable to tolerate activating and GI side effects lorazepam 1 mg PRN (PCP) -recently gone 10 tablets Vyvanse 20 mg daily, feels like this makes her more anxious at times.  Mood: "anxious and some depression from feeling so anxious" - stated that she wakes up anxious and that something bad is going to go wrong - feel on edge and jittery - panic attacks 2-3x/week out of her sleep, none the last 2 weeks... x1 yr of duration - feels unmotivated x6 mo  - difficulty with focus - ruminating thoughts that interfere  with sleep  Stressors: daughter born May 2023, school kinesiology   Sleep: tries 10pm, stay up until 3am, would wake up almost every hour until 8 AM when class starts on the weekday.  She does sleep and more during the weekend - issues staying asleep - wakes up panic, 2-3x/night.  SOB, palpitation, sweating, shaking, crying, impending doom Happened in the past during highschool  Caffeine: Denied  Appetite: Fair, no  change  EtOH: Denied Nicotine: Denied Cannabis: Denied Other substances: Denied  Discussed with patient to be cautious when using Vyvanse, as it can exacerbate anxiety.  Discussed the future options of possibly none stimulant for ADHD/improving energy and motivation such as Wellbutrin  Also discussed with patient that we will be ruling out other medical causes that could be contributing to her fatigue, anxiety, other symptoms.  Stated that she would prefer to see labs from her PCP's office  Patient amenable to starting lexapro and vistaril after discussing the risks (including FDA black box warning of SI), benefits, and side effects.  Otherwise patient had no other questions or concerns and was amenable to plan per above.  Safety: Denied active and passive SI, HI, AVH, paranoia. Patient contracted to safety, stated they would call grandparents. Patient is aware of [facility], 988 and 911 as well.   Review of Systems  Constitutional:  Positive for activity change and fatigue. Negative for appetite change and unexpected weight change.  Respiratory:  Positive for chest tightness and shortness of breath.   Cardiovascular:  Positive for palpitations. Negative for chest pain.  Gastrointestinal:  Negative for abdominal pain, constipation, diarrhea and nausea.  Neurological:  Negative for dizziness, seizures, syncope, weakness and light-headedness.  Psychiatric/Behavioral:  Positive for decreased concentration.     Visit Diagnosis:    ICD-10-CM   1. Generalized anxiety disorder with panic attacks  F41.1 escitalopram (LEXAPRO) 10 MG tablet   F41.0 hydrOXYzine (ATARAX) 25 MG tablet      Past Psychiatric History:  Diagnoses: ADHD (at 22yo), GAD (2018), MDD (2022) Medication trials:  Concerta (side effects) > vyvanse (22yo-current) Zoloft (2022-2024, up to 50 mg, d/c activating+GI SEs) > lexapro (07/2023-current) Remeron (PRN, worked for sleep), hydroxyzine, ativan (per PCP, PRN, appropriate  use) Previous psychiatrist/therapist:  [facility] (on ED/Urgent Care: [facility] for anxiety/depression (2022) Hospitalizations: Denied Suicide attempts: Denied SIB: yes, 10th-11th, cutting  Hx of violence towards others: Denied Current access to guns: in house, grandparents, locked in safe Hx of trauma/abuse: Denied  Substance Use History: EtOH:  reports no history of alcohol use. Nicotine:  reports that she has never smoked. She has never been exposed to tobacco smoke. She has never used smokeless tobacco. Marijuana: Denied IV drug use: Denied Stimulants: Denied Opiates: Denied Sedative/hypnotics: Denied Hallucinogens: Denied  Past Medical History: Dx:  has a past medical history of ADHD (attention deficit hyperactivity disorder), Anemia, Asthma, Complication of anesthesia, Depression, GAD (generalized anxiety disorder), Headache disorder (06/22/2020), History of RSV infection (10/20/2022), History of tonsillitis (11/24/2022), MDD (major depressive disorder), PONV (postoperative nausea and vomiting), Supervision of normal first pregnancy (08/16/2021), and Unintended weight loss (10/08/2022).  Head trauma: Denied Seizures: Denied Allergies: Latex   Family Psychiatric History:  Suicide: Denied Homicide: Denied Psych hospitalization: dad BiPD: dad  SCZ/SCzA: Denied Substance use: Denied Others: mom anxiety, MGP depression  Social History:  Housing: Alone with daughter  Income: Consulting civil engineer Family: Mom in [place]. dad in [place], [place]. maternal grandparents [place], [place] Born and raised in [place]. Maternal grandparents moved to [facility] in  2016. Patient stayed behind with her biological mother back in [place] until fall 2018, MGM decided to bring the patient to [place], [place] as she was not doing well there. Patient has been living with her maternal grandparents since then.  Education: Pre-PA, kinesiology major Marital Status: Single Children: x1 daughter (born 03/2022) Support: family Legal:  Denied DUI/DWI: Denied Jail/prison: Denied Developmental: 504 plan in highschool for ADHD  Past Medical History:  Past Medical History:  Diagnosis Date   ADHD (attention deficit hyperactivity disorder)    Anemia    Asthma    Complication of anesthesia    Depression    GAD (generalized anxiety disorder)    Headache disorder 06/22/2020   History of RSV infection 10/20/2022   ED visit in epic positive test and dx viral tonsillitis   History of tonsillitis 11/24/2022   (12-25-2022 per pt symptoms completely resolved)  ED visit in epic, can't breath or swallow due to enlarged tonsill's , dx acute pharyngitis;   in epic per  PCP note 11-26-2022  dx tonsillitis w/ exudate , given prednisone pack   MDD (major depressive disorder)    12/28/22   PONV (postoperative nausea and vomiting)    Supervision of normal first pregnancy 08/16/2021              Nursing Staff    Provider      Office Location     [facility]    Dating     LMP      Language      ENGLISH    Anatomy US     CPC, resolved on follow-up      Flu Vaccine     08/14/2021    Genetic/Carrier Screen     NIPS: low risk  AFP:   Screen neg  Horizon: negative      TDaP Vaccine      12/20/2021    Hgb A1C or   GTT    Early   Third trimester 74-92-84      COVID Vaccine     YES         L   Unintended weight loss 10/08/2022    Past Surgical History:  Procedure Laterality Date   LABIOPLASTY N/A 12/31/2022   Procedure: LABIAPLASTY;  Surgeon: Osborn Coho, MD;  Location: [facility];  Service: Gynecology;  Laterality: N/A;   NO PAST SURGERIES     WISDOM TOOTH EXTRACTION     removed on 08/16/2022.  11/28/2021   Family History:  Family History  Problem Relation Age of Onset   Depression Mother    Asthma Mother    Depression Father    Asthma Father    Hyperlipidemia Maternal Grandmother    Heart disease Maternal Grandmother    Hypertension Maternal Grandmother    Hyperlipidemia Maternal Grandfather    Heart disease Maternal Grandfather    COPD  Maternal Grandfather    Hypertension Maternal Grandfather    Diabetes Maternal Grandfather    Social History   Socioeconomic History   Marital status: Single    Spouse name: Not on file   Number of children: Not on file   Years of education: Not on file   Highest education level: Not on file  Occupational History   Not on file  Tobacco Use   Smoking status: Never    Passive exposure: Never   Smokeless tobacco: Never  Vaping Use   Vaping status: Never Used  Substance and Sexual Activity   Alcohol use: Never  Drug use: Never   Sexual activity: Yes    Birth control/protection: Inserts    Comment: nuvaring  Other Topics Concern   Not on file  Social History Narrative   Not on file   Social Determinants of Health   Financial Resource Strain: Not on file  Food Insecurity: Not on file  Transportation Needs: Not on file  Physical Activity: Not on file  Stress: Not on file  Social Connections: Unknown (03/20/2022)   Received from [facility]   Social Network    Social Network: Not on file    Allergies:  Allergies  Allergen Reactions   Latex Itching and Swelling    Reports swelling of mouth and itching    Current Medications: Current Outpatient Medications  Medication Sig Dispense Refill   escitalopram (LEXAPRO) 10 MG tablet Take 1 tablet (10 mg total) by mouth daily. 30 tablet 1   hydrOXYzine (ATARAX) 25 MG tablet Take 1 tablet (25 mg total) by mouth at bedtime and may repeat dose one time if needed. May also take 0.5-1 tablets (12.5-25 mg total) daily as needed for anxiety (sleep). 30 tablet 0   etonogestrel-ethinyl estradiol (NUVARING) 0.12-0.015 MG/24HR vaginal ring Place 1 each vaginally every 28 (twenty-eight) days.     fluticasone (FLOVENT HFA) 110 MCG/ACT inhaler Inhale 2 puffs into the lungs daily as needed (asthma).     LORazepam (ATIVAN) 1 MG tablet Take 1 tablet (1 mg total) by mouth daily as needed for anxiety or sedation. 10 tablet 0    VENTOLIN HFA 108 (90 Base) MCG/ACT inhaler Inhale 2 puffs into the lungs every 4 (four) hours as needed for shortness of breath.     No current facility-administered medications for this visit.    Objective: Psychiatric Specialty Exam: Last menstrual period 05/25/2023, not currently breastfeeding.There is no height or weight on file to calculate BMI.  General Appearance: Casual, faily groomed  Eye Contact:  Good    Speech:  Clear, coherent, normal rate   Volume:  Normal   Mood:  "anxious, depressed"  Affect:  Appropriate, congruent, full range  Thought Content: Logical, rumination  Suicidal Thoughts: Denied active and passive SI    Thought Process:  Coherent, goal-directed, linear   Orientation:  A&Ox4   Memory:  Immediate good  Judgment:  Fair   Insight:  Fair   Concentration:  Attention and concentration good   Recall:  Good  Fund of Knowledge: Good  Language: Good, fluent  Psychomotor Activity: Normal  Akathisia:  NA   AIMS (if indicated): NA   Assets:  Communication Skills Desire for Improvement Financial Resources/Insurance Housing Intimacy Leisure Time Physical Health Resilience Social Support Talents/Skills Transportation Vocational/Educational  ADL's:  Intact  Cognition: WNL  Sleep:  Poor, initial and middle insomnia    Physical Exam Vitals and nursing note reviewed.  Constitutional:      General: She is awake. She is not in acute distress.    Appearance: She is not ill-appearing, toxic-appearing or diaphoretic.  HENT:     Head: Normocephalic.  Pulmonary:     Effort: Pulmonary effort is normal. No respiratory distress.  Neurological:     General: No focal deficit present.     Mental Status: She is alert and oriented to person, place, and time.     Gait: Gait normal.      Metabolic Disorder Labs: No results found for: "HGBA1C", "MPG" No results found for: "PROLACTIN" Lab Results  Component Value Date   CHOL 177 07/12/2023  TRIG 61.0 07/12/2023    HDL 72.80 07/12/2023   CHOLHDL 2 07/12/2023   VLDL 12.2 07/12/2023   LDLCALC 92 07/12/2023   Lab Results  Component Value Date   TSH 1.22 07/12/2023    Therapeutic Level Labs: No results found for: "LITHIUM" No results found for: "VALPROATE" No results found for: "CBMZ"  Screenings: GAD-7    Flowsheet Row Office Visit from 07/12/2023 in [facility] from 10/08/2022 in [facility] Video Visit from 08/10/2022 in [facility] Clinical Support from 05/10/2022 in [facility] Routine Prenatal from 12/20/2021 in [facility] for [facility] [place]  Total GAD-7 Score 20 17 15 16  0      PHQ2-9    Flowsheet Row Office Visit from 07/12/2023 in [facility] Office Visit from 10/08/2022 in [facility] Video Visit from 08/10/2022 in [facility] Clinical Support from 05/10/2022 in [facility] Counselor from 01/30/2022 in [facility]  PHQ-2 Total Score 5 4 5 3  0  PHQ-9 Total Score 12 23 16 11 3       Flowsheet Row Admission (Discharged) from 12/31/2022 in [place] [facility] ED from 11/24/2022 in [facility] Emergency Department at [facility] ED from 10/20/2022 in [facility] Emergency Department at [facility]  C-SSRS RISK CATEGORY No Risk No Risk No Risk       Patient/Guardian was advised Release of Information must be obtained prior to any record release in order to collaborate their care with an outside provider. Patient/Guardian was advised if they have not already done so to contact the registration department to sign all necessary forms in order for [place] to release information regarding their care.   Consent: Patient/Guardian gives verbal consent for treatment and assignment of benefits for services provided during  this visit. Patient/Guardian expressed understanding and agreed to proceed.   Princess Bruins, DO Psych Resident, PGY-3

## 2023-07-28 ENCOUNTER — Other Ambulatory Visit (HOSPITAL_COMMUNITY): Payer: Self-pay | Admitting: Student

## 2023-07-28 DIAGNOSIS — F410 Panic disorder [episodic paroxysmal anxiety] without agoraphobia: Secondary | ICD-10-CM

## 2023-07-29 ENCOUNTER — Encounter: Payer: Self-pay | Admitting: Family

## 2023-07-30 NOTE — Telephone Encounter (Signed)
She needs to schedule appointment, thx

## 2023-07-31 ENCOUNTER — Encounter: Payer: Self-pay | Admitting: Family

## 2023-07-31 ENCOUNTER — Ambulatory Visit: Payer: Medicaid Other | Admitting: Family

## 2023-07-31 VITALS — BP 105/69 | HR 68 | Temp 96.8°F | Ht 64.0 in | Wt 131.0 lb

## 2023-07-31 DIAGNOSIS — Z8639 Personal history of other endocrine, nutritional and metabolic disease: Secondary | ICD-10-CM

## 2023-07-31 DIAGNOSIS — F410 Panic disorder [episodic paroxysmal anxiety] without agoraphobia: Secondary | ICD-10-CM | POA: Diagnosis not present

## 2023-07-31 DIAGNOSIS — F419 Anxiety disorder, unspecified: Secondary | ICD-10-CM

## 2023-07-31 DIAGNOSIS — R002 Palpitations: Secondary | ICD-10-CM

## 2023-07-31 DIAGNOSIS — F32A Depression, unspecified: Secondary | ICD-10-CM

## 2023-07-31 DIAGNOSIS — F411 Generalized anxiety disorder: Secondary | ICD-10-CM

## 2023-07-31 DIAGNOSIS — D500 Iron deficiency anemia secondary to blood loss (chronic): Secondary | ICD-10-CM | POA: Diagnosis not present

## 2023-07-31 DIAGNOSIS — D509 Iron deficiency anemia, unspecified: Secondary | ICD-10-CM | POA: Insufficient documentation

## 2023-07-31 LAB — FOLATE: Folate: 20 ng/mL (ref >5.9)

## 2023-07-31 LAB — VITAMIN B12: Vitamin B-12: 537 pg/mL (ref 211–911)

## 2023-07-31 LAB — IBC + FERRITIN
Ferritin: 3.7 ng/mL — ABNORMAL LOW (ref 10.0–291.0)
Iron: 34 ug/dL — ABNORMAL LOW (ref 42–145)
Saturation Ratios: 5.9 % — ABNORMAL LOW (ref 20.0–50.0)
TIBC: 575.4 ug/dL — ABNORMAL HIGH (ref 250.0–450.0)
Transferrin: 411 mg/dL — ABNORMAL HIGH (ref 212.0–360.0)

## 2023-07-31 LAB — VITAMIN D 25 HYDROXY (VIT D DEFICIENCY, FRACTURES): VITD: 23.7 ng/mL — ABNORMAL LOW (ref 30.00–100.00)

## 2023-07-31 NOTE — Progress Notes (Signed)
Patient ID: Brandi Church, female    DOB: January 30, 2001, 22 y.o.   MRN: [id number]  Chief Complaint  Patient presents with   Follow-up   *Discussed the use of AI scribe software for clinical note transcription with the patient, who gave verbal consent to proceed.  History of Present Illness   The patient, with a history of anxiety, panic attacks, and heart palpitations, presents for follow-up after a recent psychiatric consultation. She reports intermittent heart palpitations, which have previously been evaluated by a cardiologist during the COVID-19 pandemic, & she was also having continuous tachycardia . The palpitations are associated with anxiety and panic attacks, but the patient denies any other cardiac symptoms. She has not had any recent cardiac testing due to financial constraints, but now has insurance coverage. HX of taking Zoloft in 2022 & Hydroxyzine 10mg  prn, also was prescribed by Psych & also her Vyvanse; then her OB took over after her baby was born, pt states her Psych NP provider left the practice. OB also prescribed Ativan to help pt not worry about her baby overnight & for panic attacks r/t to being a young first time mom, & uses for dental visits, has not taken in long time, just likes to have on hand. *Seen by new Psych & recommended labs & possible cardio workup d/t nighttime panic awakenings and palpitations. Pt reports having seen [facility] cardiology in past d/t tachycardia after covid but had no insurance & couldn't afford a heart monitor.  The patient also reports heavy menstrual cycles, which have been a long-standing issue and were the initial reason for starting birth control. She has recently switched to the NuvaRing, which she feels is better for her mental health due to less potent hormonal effects. She previously tried an IUD, but experienced daily bleeding for three months, leading to discontinuation.  In addition, the patient has a history of low iron levels, which have  previously required iron infusions, particularly during pregnancy. She is currently not taking Vyvanse, as advised by her psychiatrist, and is considering a switch to Wellbutrin.      Assessment & Plan:     Palpitations - History of palpitations, possibly related to anxiety. EKG performed at urgent care. Patient has seen a cardiologist in the past. Currently on Lexapro 10mg  and hydroxyzine as needed. -Consider ordering a heart monitor for further evaluation.  Anxiety - Patient recently switched to Lexapro 10mg  and hydroxyzine as needed for anxiety management. Vyvanse discontinued for now, her psych is considering Wellbutrin in future. -Continue current regimen and monitor response.  Iron Deficiency - History of low iron levels, possibly related to heavy menstrual cycles & more recently d/t pregnancy. Patient required iron infusions during pregnancy. -Order labs to check iron levels, vitamin D, B12, and folate.  General Health Maintenance -Advise patient to limit caffeine intake to avoid aggravating heart rate. -Results of lab tests will be available on MyChart.      Subjective:    Outpatient Medications Prior to Visit  Medication Sig Dispense Refill   escitalopram (LEXAPRO) 10 MG tablet Take 1 tablet (10 mg total) by mouth daily. 30 tablet 1   etonogestrel-ethinyl estradiol (NUVARING) 0.12-0.015 MG/24HR vaginal ring Place 1 each vaginally every 28 (twenty-eight) days.     fluticasone (FLOVENT HFA) 110 MCG/ACT inhaler Inhale 2 puffs into the lungs daily as needed (asthma).     hydrOXYzine (ATARAX) 25 MG tablet Take 1 tablet (25 mg total) by mouth at bedtime and may repeat dose one time if needed.  May also take 0.5-1 tablets (12.5-25 mg total) daily as needed for anxiety (sleep). 30 tablet 0   LORazepam (ATIVAN) 1 MG tablet Take 1 tablet (1 mg total) by mouth daily as needed for anxiety or sedation. 10 tablet 0   VENTOLIN HFA 108 (90 Base) MCG/ACT inhaler Inhale 2 puffs into the lungs  every 4 (four) hours as needed for shortness of breath.     No facility-administered medications prior to visit.   Past Medical History:  Diagnosis Date   ADHD (attention deficit hyperactivity disorder)    Anemia    Asthma    Complication of anesthesia    Depression    GAD (generalized anxiety disorder)    Headache disorder 06/22/2020   History of RSV infection 10/20/2022   ED visit in epic positive test and dx viral tonsillitis   History of tonsillitis 11/24/2022   (12-25-2022 per pt symptoms completely resolved)  ED visit in epic, can't breath or swallow due to enlarged tonsill's , dx acute pharyngitis;   in epic per  PCP note 11-26-2022  dx tonsillitis w/ exudate , given prednisone pack   MDD (major depressive disorder)    12/28/22   MDD (major depressive disorder), recurrent episode, moderate (HCC) 01/19/2021   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/26/2023   PONV (postoperative nausea and vomiting)    Supervision of normal first pregnancy 08/16/2021              Nursing Staff    Provider      Office Location     [facility]    Dating     LMP      Language      ENGLISH    Anatomy US     CPC, resolved on follow-up      Flu Vaccine     08/14/2021    Genetic/Carrier Screen     NIPS: low risk  AFP:   Screen neg  Horizon: negative      TDaP Vaccine      12/20/2021    Hgb A1C or   GTT    Early   Third trimester 74-92-84      COVID Vaccine     YES         L   Unintended weight loss 10/08/2022   Past Surgical History:  Procedure Laterality Date   LABIOPLASTY N/A 12/31/2022   Procedure: LABIAPLASTY;  Surgeon: Osborn Coho, MD;  Location: [facility] OR;  Service: Gynecology;  Laterality: N/A;   NO PAST SURGERIES     WISDOM TOOTH EXTRACTION     removed on 08/16/2022.  11/28/2021   Allergies  Allergen Reactions   Latex Itching and Swelling    Reports swelling of mouth and itching      Objective:    Physical Exam Vitals and nursing note reviewed.  Constitutional:      Appearance:  Normal appearance.  Cardiovascular:     Rate and Rhythm: Normal rate and regular rhythm.  Pulmonary:     Effort: Pulmonary effort is normal.     Breath sounds: Normal breath sounds.  Musculoskeletal:        General: Normal range of motion.  Skin:    General: Skin is warm and dry.  Neurological:     Mental Status: She is alert.  Psychiatric:        Mood and Affect: Mood normal.        Behavior: Behavior normal.    BP 105/69 (BP Location: Left Arm, Patient Position:  Sitting, Cuff Size: Large)   Pulse 68   Temp (!) 96.8 F (36 C) (Temporal)   Ht 5\' 4"  (1.626 m)   Wt 131 lb (59.4 kg)   LMP 05/25/2023 (Exact Date)   SpO2 98%   Breastfeeding No   BMI 22.49 kg/m  Wt Readings from Last 3 Encounters:  07/31/23 131 lb (59.4 kg)  07/12/23 128 lb (58.1 kg)  12/31/22 130 lb (59 kg) (54%, Z= 0.11)*   * Growth percentiles are based on CDC (Girls, 2-20 Years) data.      Dulce Sellar, NP

## 2023-08-02 NOTE — Addendum Note (Signed)
Addended byDulce Sellar on: 08/02/2023 12:51 PM   Modules accepted: Orders

## 2023-08-06 DIAGNOSIS — Z419 Encounter for procedure for purposes other than remedying health state, unspecified: Secondary | ICD-10-CM | POA: Diagnosis not present

## 2023-08-08 ENCOUNTER — Encounter: Payer: Self-pay | Admitting: Family

## 2023-08-08 DIAGNOSIS — D500 Iron deficiency anemia secondary to blood loss (chronic): Secondary | ICD-10-CM

## 2023-08-08 MED ORDER — FERROUS SULFATE 325 (65 FE) MG PO TBEC: 325.0000 mg | DELAYED_RELEASE_TABLET | Freq: Every day | ORAL | 1 refills | Status: DC

## 2023-08-08 NOTE — Telephone Encounter (Signed)
sent to [facility] on [address]

## 2023-08-12 ENCOUNTER — Emergency Department (HOSPITAL_BASED_OUTPATIENT_CLINIC_OR_DEPARTMENT_OTHER): Payer: Medicaid Other | Admitting: Radiology

## 2023-08-12 ENCOUNTER — Encounter (HOSPITAL_BASED_OUTPATIENT_CLINIC_OR_DEPARTMENT_OTHER): Payer: Self-pay | Admitting: Radiology

## 2023-08-12 ENCOUNTER — Other Ambulatory Visit: Payer: Self-pay

## 2023-08-12 ENCOUNTER — Emergency Department (HOSPITAL_BASED_OUTPATIENT_CLINIC_OR_DEPARTMENT_OTHER)
Admission: EM | Admit: 2023-08-12 | Discharge: 2023-08-12 | Disposition: A | Discharge status: Home / Self Care | Payer: Medicaid Other | Attending: Emergency Medicine | Admitting: Emergency Medicine

## 2023-08-12 DIAGNOSIS — R0789 Other chest pain: Secondary | ICD-10-CM | POA: Diagnosis not present

## 2023-08-12 DIAGNOSIS — B349 Viral infection, unspecified: Secondary | ICD-10-CM | POA: Insufficient documentation

## 2023-08-12 DIAGNOSIS — Z91040 Latex allergy status: Secondary | ICD-10-CM | POA: Diagnosis not present

## 2023-08-12 DIAGNOSIS — R6883 Chills (without fever): Secondary | ICD-10-CM | POA: Diagnosis present

## 2023-08-12 DIAGNOSIS — R079 Chest pain, unspecified: Secondary | ICD-10-CM | POA: Diagnosis not present

## 2023-08-12 DIAGNOSIS — Z1152 Encounter for screening for COVID-19: Secondary | ICD-10-CM | POA: Diagnosis not present

## 2023-08-12 LAB — CBC
HCT: 33.7 % — ABNORMAL LOW (ref 36.0–46.0)
Hemoglobin: 10.4 g/dL — ABNORMAL LOW (ref 12.0–15.0)
MCH: 23 pg — ABNORMAL LOW (ref 26.0–34.0)
MCHC: 30.9 g/dL (ref 30.0–36.0)
MCV: 74.6 fL — ABNORMAL LOW (ref 80.0–100.0)
Platelets: 257 10*3/uL (ref 150–400)
RBC: 4.52 MIL/uL (ref 3.87–5.11)
RDW: 14.2 % (ref 11.5–15.5)
WBC: 1.7 10*3/uL — ABNORMAL LOW (ref 4.0–10.5)
nRBC: 0 % (ref 0.0–0.2)

## 2023-08-12 LAB — RESP PANEL BY RT-PCR (RSV, FLU A&B, COVID)  RVPGX2
Influenza A by PCR: NEGATIVE
Influenza B by PCR: NEGATIVE
Resp Syncytial Virus by PCR: NEGATIVE
SARS Coronavirus 2 by RT PCR: NEGATIVE

## 2023-08-12 LAB — URINALYSIS, ROUTINE W REFLEX MICROSCOPIC
Bacteria, UA: NONE SEEN
Bilirubin Urine: NEGATIVE
Glucose, UA: NEGATIVE mg/dL
Hgb urine dipstick: NEGATIVE
Ketones, ur: 15 mg/dL — AB
Leukocytes,Ua: NEGATIVE
Nitrite: NEGATIVE
Protein, ur: 30 mg/dL — AB
Specific Gravity, Urine: 1.026 (ref 1.005–1.030)
pH: 6 (ref 5.0–8.0)

## 2023-08-12 LAB — TROPONIN I (HIGH SENSITIVITY)
Troponin I (High Sensitivity): <2 ng/L (ref <18)
Troponin I (High Sensitivity): <2 ng/L (ref <18)

## 2023-08-12 LAB — PREGNANCY, URINE: Preg Test, Ur: NEGATIVE

## 2023-08-12 LAB — BASIC METABOLIC PANEL
Anion gap: 11 (ref 5–15)
BUN: 6 mg/dL (ref 6–20)
CO2: 18 mmol/L — ABNORMAL LOW (ref 22–32)
Calcium: 8.7 mg/dL — ABNORMAL LOW (ref 8.9–10.3)
Chloride: 104 mmol/L (ref 98–111)
Creatinine, Ser: 0.65 mg/dL (ref 0.44–1.00)
GFR, Estimated: >60 mL/min (ref >60)
Glucose, Bld: 116 mg/dL — ABNORMAL HIGH (ref 70–99)
Potassium: 3.5 mmol/L (ref 3.5–5.1)
Sodium: 133 mmol/L — ABNORMAL LOW (ref 135–145)

## 2023-08-12 LAB — HEPATIC FUNCTION PANEL
ALT: 26 U/L (ref 0–44)
AST: 26 U/L (ref 15–41)
Albumin: 3.7 g/dL (ref 3.5–5.0)
Alkaline Phosphatase: 32 U/L — ABNORMAL LOW (ref 38–126)
Bilirubin, Direct: 0.2 mg/dL (ref 0.0–0.2)
Indirect Bilirubin: 0.4 mg/dL (ref 0.3–0.9)
Total Bilirubin: 0.6 mg/dL (ref 0.3–1.2)
Total Protein: 7 g/dL (ref 6.5–8.1)

## 2023-08-12 MED ORDER — IBUPROFEN 800 MG PO TABS
800.0000 mg | ORAL_TABLET | Freq: Once | ORAL | Status: AC
  Administered 2023-08-12: 800 mg via ORAL
  Filled 2023-08-12: qty 1

## 2023-08-12 MED ORDER — ACETAMINOPHEN 325 MG PO TABS
650.0000 mg | ORAL_TABLET | Freq: Once | ORAL | Status: AC
  Administered 2023-08-12: 650 mg via ORAL
  Filled 2023-08-12: qty 2

## 2023-08-12 MED ORDER — ONDANSETRON HCL 4 MG/2ML IJ SOLN
4.0000 mg | Freq: Once | INTRAMUSCULAR | Status: AC
  Administered 2023-08-12: 4 mg via INTRAVENOUS
  Filled 2023-08-12: qty 2

## 2023-08-12 MED ORDER — ONDANSETRON HCL 4 MG PO TABS: 4.0000 mg | ORAL_TABLET | Freq: Four times a day (QID) | ORAL | 0 refills | Status: DC

## 2023-08-12 MED ORDER — IBUPROFEN 600 MG PO TABS: 600.0000 mg | ORAL_TABLET | Freq: Three times a day (TID) | ORAL | 0 refills | Status: DC | PRN

## 2023-08-12 MED ORDER — SODIUM CHLORIDE 0.9 % IV BOLUS
1000.0000 mL | Freq: Once | INTRAVENOUS | Status: AC
  Administered 2023-08-12: 1000 mL via INTRAVENOUS

## 2023-08-12 NOTE — ED Provider Notes (Signed)
[facility] AT [facility] Provider Note   CSN: [id number] Arrival date & time: 08/12/23  1148     History  Chief Complaint  Patient presents with   Chest Pain    Brandi Church is a 22 y.o. female.  HPI   22 year old female presents to the emergency department with concern for body wide pain, muscle aches and chest tightness.  Patient has history of heart palpitations that she has been evaluated as an outpatient for.  She states that she feels dehydrated and since is the palpitations are worse and more persistent.  She has nausea but denies any vomiting or diarrhea.  Her daughter came home from daycare and she is sick as well.  No documented fever but she endorses chills.  Home Medications Prior to Admission medications   Medication Sig Start Date End Date Taking? Authorizing Provider  ibuprofen (ADVIL) 600 MG tablet Take 1 tablet (600 mg total) by mouth every 8 (eight) hours as needed. 08/12/23  Yes Deysi Soldo, Clabe Seal, DO  ondansetron (ZOFRAN) 4 MG tablet Take 1 tablet (4 mg total) by mouth every 6 (six) hours. 08/12/23  Yes Hailyn Zarr M, DO  escitalopram (LEXAPRO) 10 MG tablet Take 1 tablet (10 mg total) by mouth daily. 07/26/23 09/24/23  Princess Bruins, DO  etonogestrel-ethinyl estradiol (NUVARING) 0.12-0.015 MG/24HR vaginal ring Place 1 each vaginally every 28 (twenty-eight) days. 10/01/22   [provider]  ferrous sulfate 325 (65 FE) MG EC tablet Take 1 tablet (325 mg total) by mouth daily with breakfast. 08/08/23   Dulce Sellar, NP  fluticasone (FLOVENT HFA) 110 MCG/ACT inhaler Inhale 2 puffs into the lungs daily as needed (asthma).    [provider]  hydrOXYzine (ATARAX) 25 MG tablet Take 1 tablet (25 mg total) by mouth at bedtime and may repeat dose one time if needed. May also take 0.5-1 tablets (12.5-25 mg total) daily as needed for anxiety (sleep). 07/26/23 08/25/23  Princess Bruins, DO  LORazepam (ATIVAN) 1 MG tablet Take 1  tablet (1 mg total) by mouth daily as needed for anxiety or sedation. 07/12/23   Dulce Sellar, NP  VENTOLIN HFA 108 (90 Base) MCG/ACT inhaler Inhale 2 puffs into the lungs every 4 (four) hours as needed for shortness of breath. 06/23/22   [provider]      Allergies    Latex    Review of Systems   Review of Systems  Constitutional:  Positive for appetite change, chills and fatigue. Negative for fever.  Respiratory:  Positive for chest tightness. Negative for shortness of breath.   Cardiovascular:  Negative for chest pain, palpitations and leg swelling.  Gastrointestinal:  Positive for nausea. Negative for abdominal pain, diarrhea and vomiting.  Musculoskeletal:  Positive for arthralgias and myalgias. Negative for neck pain.  Skin:  Negative for rash.  Neurological:  Negative for headaches.    Physical Exam Updated Vital Signs BP 107/70   Pulse (!) 106   Temp 98.5 F (36.9 C) (Oral)   Resp (!) 21   LMP 08/10/2023 (Exact Date)   SpO2 100%  Physical Exam Vitals and nursing note reviewed.  Constitutional:      General: She is not in acute distress.    Appearance: Normal appearance.  HENT:     Head: Normocephalic.     Mouth/Throat:     Mouth: Mucous membranes are moist.  Cardiovascular:     Rate and Rhythm: Tachycardia present.  Pulmonary:     Effort: Pulmonary effort is  normal. No respiratory distress.     Breath sounds: No decreased breath sounds.  Abdominal:     Palpations: Abdomen is soft.     Tenderness: There is no abdominal tenderness. There is no guarding.  Musculoskeletal:     Right lower leg: No edema.  Skin:    General: Skin is warm.  Neurological:     Mental Status: She is alert and oriented to person, place, and time. Mental status is at baseline.  Psychiatric:        Mood and Affect: Mood normal.     ED Results / Procedures / Treatments   Labs (all labs ordered are listed, but only abnormal results are displayed) Labs Reviewed  BASIC  METABOLIC PANEL - Abnormal; Notable for the following components:      Result Value   Sodium 133 (*)    CO2 18 (*)    Glucose, Bld 116 (*)    Calcium 8.7 (*)    All other components within normal limits  CBC - Abnormal; Notable for the following components:   WBC 1.7 (*)    Hemoglobin 10.4 (*)    HCT 33.7 (*)    MCV 74.6 (*)    MCH 23.0 (*)    All other components within normal limits  URINALYSIS, ROUTINE W REFLEX MICROSCOPIC - Abnormal; Notable for the following components:   Ketones, ur 15 (*)    Protein, ur 30 (*)    All other components within normal limits  HEPATIC FUNCTION PANEL - Abnormal; Notable for the following components:   Alkaline Phosphatase 32 (*)    All other components within normal limits  RESP PANEL BY RT-PCR (RSV, FLU A&B, COVID)  RVPGX2  PREGNANCY, URINE  TROPONIN I (HIGH SENSITIVITY)  TROPONIN I (HIGH SENSITIVITY)    EKG EKG Interpretation Date/Time:  Monday August 12 2023 12:37:47 EDT Ventricular Rate:  130 PR Interval:  156 QRS Duration:  70 QT Interval:  274 QTC Calculation: 403 R Axis:   55  Text Interpretation: Sinus tachycardia Cannot rule out Anterior infarct , age undetermined Abnormal ECG When compared with ECG of 05-May-2021 16:02, PREVIOUS ECG IS PRESENT Confirmed by Coralee Pesa [phone]) on 08/12/2023 4:11:29 PM  Radiology DG Chest 2 View  Result Date: 08/12/2023 CLINICAL DATA:  Chest pain. EXAM: CHEST - 2 VIEW COMPARISON:  None Available. FINDINGS: Bilateral lung fields are clear. Bilateral costophrenic angles are clear. Normal cardio-mediastinal silhouette. No acute osseous abnormalities. The soft tissues are within normal limits. IMPRESSION: No active cardiopulmonary disease. Electronically Signed   By: Jules Schick M.D.   On: 08/12/2023 14:16    Procedures Procedures    Medications Ordered in ED Medications  sodium chloride 0.9 % bolus 1,000 mL (0 mLs Intravenous Stopped 08/12/23 1729)  acetaminophen (TYLENOL) tablet 650 mg  (650 mg Oral Given 08/12/23 1656)  ondansetron (ZOFRAN) injection 4 mg (4 mg Intravenous Given 08/12/23 1820)  ibuprofen (ADVIL) tablet 800 mg (800 mg Oral Given 08/12/23 2047)    ED Course/ Medical Decision Making/ A&P                                 Medical Decision Making Amount and/or Complexity of Data Reviewed Labs: ordered. Radiology: ordered.  Risk OTC drugs. Prescription drug management.   22 year old female presents emergency department with muscle aches, chills, nausea and chest tightness.  Patient is tachycardic in triage, stable blood pressure.  Patient continues to be  tachycardic on my evaluation is also febrile up to 103.  She is not acutely ill-appearing but is complaining of generalized bodyaches.  Dose of Tylenol ordered.  IV fluids ordered.  Blood work shows slight neutropenia, mild dehydration in the urinalysis, cardiac workup is negative, respiratory panel is negative.  Chest x-ray shows no pneumonia.  After antipyretic and IV fluids patient feels improved, tachycardia is improving.  Blood pressure is normalized, fever has resolved.  Low suspicion at this time for PE in the setting of fever/tachycardia.  She is not hypoxic.  After further medication and nausea medicine she should be able to tolerate p.o.  She is continue to orally hydrate and her heart rate is now 103, stable blood pressure.  Will recommend symptomatic outpatient treatment and strict return to ED precautions.  Patient at this time appears safe and stable for discharge and close outpatient follow up. Discharge plan and strict return to ED precautions discussed, patient verbalizes understanding and agreement.  Patient was seen during a critical IV fluid shortage secondary to [facility].        Final Clinical Impression(s) / ED Diagnoses Final diagnoses:  Viral syndrome    Rx / DC Orders ED Discharge Orders          Ordered    ondansetron (ZOFRAN) 4 MG tablet  Every 6 hours         08/12/23 2052    ibuprofen (ADVIL) 600 MG tablet  Every 8 hours PRN        08/12/23 2052              Stefany Starace, Clabe Seal, DO 08/12/23 2103

## 2023-08-12 NOTE — Discharge Instructions (Signed)
You have been seen and discharged from the emergency department.  You were found to have a fever and diagnosed with a viral syndrome.  Your viral respiratory panel was negative, chest x-ray showed pneumonia, blood work showed mild dehydration but no other concerning findings.  You were treated with Tylenol, ibuprofen and nausea medicine and IV fluids.  It is recommended that you alternate Tylenol and ibuprofen for fever/pain control.  You may take 650 mg of Tylenol, then 4 hours later 600 mg of ibuprofen and repeat this for the next 24 hours.  Following this you may decrease the amount of dosages.  Stay very well-hydrated.  Take nausea medicine as needed.  Follow-up with your primary provider for further evaluation and further care. Take home medications as prescribed. If you have any worsening symptoms or further concerns for your health please return to an emergency department for further evaluation.

## 2023-08-12 NOTE — ED Triage Notes (Addendum)
Pt c/o "whole body pain" and chest tightness since this morning.  Pt tearful in triage.  States pain worsens with deep breathe.  Pt reports h/o "heart palpatitations" and states she is supposed to be getting a heart monitor to wear but has not received it yet.

## 2023-08-12 NOTE — ED Notes (Signed)
Discharge paperwork given and verbally understood. 

## 2023-08-12 NOTE — ED Notes (Signed)
Pt aware of the need for a urine... Unable to currently provide the sample... 

## 2023-08-13 ENCOUNTER — Ambulatory Visit (INDEPENDENT_AMBULATORY_CARE_PROVIDER_SITE_OTHER): Payer: Medicaid Other | Admitting: Family

## 2023-08-13 ENCOUNTER — Ambulatory Visit: Payer: Medicaid Other | Attending: Family

## 2023-08-13 ENCOUNTER — Encounter: Payer: Self-pay | Admitting: Family

## 2023-08-13 VITALS — BP 128/76 | HR 124 | Temp 98.8°F | Wt 129.0 lb

## 2023-08-13 DIAGNOSIS — R002 Palpitations: Secondary | ICD-10-CM | POA: Diagnosis not present

## 2023-08-13 DIAGNOSIS — J039 Acute tonsillitis, unspecified: Secondary | ICD-10-CM

## 2023-08-13 LAB — POCT RAPID STREP A (OFFICE): Rapid Strep A Screen: NEGATIVE

## 2023-08-13 MED ORDER — IBUPROFEN 800 MG PO TABS: 800.0000 mg | ORAL_TABLET | Freq: Three times a day (TID) | ORAL | 0 refills | Status: DC | PRN

## 2023-08-13 MED ORDER — LIDOCAINE VISCOUS HCL 2 % MT SOLN
15.0000 mL | OROMUCOSAL | 0 refills | Status: DC | PRN
Start: 2023-08-13 — End: 2023-10-02

## 2023-08-13 NOTE — Progress Notes (Signed)
Patient ID: Brandi Church, female    DOB: 2000-12-26, 22 y.o.   MRN: [id number]  Chief Complaint  Patient presents with   Follow-up    ED F/U. Pt was seen for persistent body aches, chills, fever, and a swollen tonsil. Pt states they tested her for COVID, Flu, & RSV and all were negative.    *Discussed the use of AI scribe software for clinical note transcription with the patient, who gave verbal consent to proceed.  History of Present Illness   The patient presents with a sore throat and fever. She reports a history of tonsillitis, but denies any previous diagnosis of strep throat. The patient's symptoms include a swollen right tonsil with white patches, and a fever of 103 degrees yesterday and 100 this am. She also reports a metallic taste in her mouth and occasional bleeding from the throat, but denies any cough. The patient took Tylenol for the fever this morning. She also mentions that her young child has a cold, but it is unclear if the two illnesses are related.  In addition to the current symptoms, the patient has a history of anxiety, panic attacks, & heart palpitations. She was previously evaluated by Cardiology when sx occurred after having Covid-19 infection, and she was to f/u for testing, but she had no insurance at that time. She is scheduled to receive a heart monitor in the mail for a 14-day monitoring period.    Assessment & Plan:     Tonsilliitis w/exudate -  Right-sided tonsillar swelling and white patches, with a history of severe tonsillitis. Fever of 100 degrees yesterday, but no cough or runny nose.  Rapid strep neg.  -Perform throat swab for strep testing. -Sending Ibuprofen 800mg  tid x 5d after meals. -Sending Lidocaine viscous rinse, gargle q3h prn for throat pain, spit or swallow. -Call back if sx are not resolving by next week.  Tachycardia/Palpitations -  Elevated heart rate noted during the visit, with a history of palpitations. -Order heart monitor to be sent  to the patient's home for a 14-day monitoring period. -Instruct patient to schedule a nurse visit if there are any concerns about how to properly use the monitor. -Advise patient to press the button on the monitor whenever palpitations are felt. -Results will be reviewed by [facility] after monitor is mailed back. I will inform pt of results after cardiology review.      Subjective:    Outpatient Medications Prior to Visit  Medication Sig Dispense Refill   escitalopram (LEXAPRO) 10 MG tablet Take 1 tablet (10 mg total) by mouth daily. 30 tablet 1   etonogestrel-ethinyl estradiol (NUVARING) 0.12-0.015 MG/24HR vaginal ring Place 1 each vaginally every 28 (twenty-eight) days.     ferrous sulfate 325 (65 FE) MG EC tablet Take 1 tablet (325 mg total) by mouth daily with breakfast. 90 tablet 1   fluticasone (FLOVENT HFA) 110 MCG/ACT inhaler Inhale 2 puffs into the lungs daily as needed (asthma).     hydrOXYzine (ATARAX) 25 MG tablet Take 1 tablet (25 mg total) by mouth at bedtime and may repeat dose one time if needed. May also take 0.5-1 tablets (12.5-25 mg total) daily as needed for anxiety (sleep). 30 tablet 0   ibuprofen (ADVIL) 600 MG tablet Take 1 tablet (600 mg total) by mouth every 8 (eight) hours as needed. 30 tablet 0   LORazepam (ATIVAN) 1 MG tablet Take 1 tablet (1 mg total) by mouth daily as needed for anxiety or sedation. 10 tablet 0  ondansetron (ZOFRAN) 4 MG tablet Take 1 tablet (4 mg total) by mouth every 6 (six) hours. 12 tablet 0   VENTOLIN HFA 108 (90 Base) MCG/ACT inhaler Inhale 2 puffs into the lungs every 4 (four) hours as needed for shortness of breath.     No facility-administered medications prior to visit.   Past Medical History:  Diagnosis Date   ADHD (attention deficit hyperactivity disorder)    Anemia    Asthma    Complication of anesthesia    Depression    GAD (generalized anxiety disorder)    Headache disorder 06/22/2020   History of RSV infection 10/20/2022    ED visit in epic positive test and dx viral tonsillitis   History of tonsillitis 11/24/2022   (12-25-2022 per pt symptoms completely resolved)  ED visit in epic, can't breath or swallow due to enlarged tonsill's , dx acute pharyngitis;   in epic per  PCP note 11-26-2022  dx tonsillitis w/ exudate , given prednisone pack   MDD (major depressive disorder)    12/28/22   MDD (major depressive disorder), recurrent episode, moderate (HCC) 01/19/2021   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/26/2023   PONV (postoperative nausea and vomiting)    Supervision of normal first pregnancy 08/16/2021              Nursing Staff    Provider      Office Location     [facility]    Dating     LMP      Language      ENGLISH    Anatomy US     [facility], resolved on follow-up      Flu Vaccine     08/14/2021    Genetic/Carrier Screen     NIPS: low risk  AFP:   Screen neg  Horizon: negative      TDaP Vaccine      12/20/2021    Hgb A1C or   GTT    Early   Third trimester 74-92-84      COVID Vaccine     YES         L   Unintended weight loss 10/08/2022   Past Surgical History:  Procedure Laterality Date   LABIOPLASTY N/A 12/31/2022   Procedure: LABIAPLASTY;  Surgeon: Osborn Coho, MD;  Location: [facility] OR;  Service: Gynecology;  Laterality: N/A;   NO PAST SURGERIES     WISDOM TOOTH EXTRACTION     removed on 08/16/2022.  11/28/2021   Allergies  Allergen Reactions   Latex Itching and Swelling    Reports swelling of mouth and itching      Objective:    Physical Exam Vitals and nursing note reviewed.  Constitutional:      Appearance: Normal appearance.  HENT:     Right Ear: Tympanic membrane and ear canal normal.     Left Ear: Tympanic membrane and ear canal normal.     Nose: No congestion or rhinorrhea.     Right Sinus: No frontal sinus tenderness.     Left Sinus: No frontal sinus tenderness.     Mouth/Throat:     Mouth: Mucous membranes are moist.     Pharynx: Posterior oropharyngeal erythema present. No  pharyngeal swelling, oropharyngeal exudate or uvula swelling.     Tonsils: Tonsillar abscesses: right. 3+ on the right. 1+ on the left.  Cardiovascular:     Rate and Rhythm: Normal rate and regular rhythm.  Pulmonary:     Effort: Pulmonary effort is normal.  Breath sounds: Normal breath sounds.  Musculoskeletal:        General: Normal range of motion.  Lymphadenopathy:     Head:     Right side of head: No tonsillar, preauricular, posterior auricular or occipital adenopathy.     Left side of head: No tonsillar, preauricular, posterior auricular or occipital adenopathy.     Cervical: No cervical adenopathy.  Skin:    General: Skin is warm and dry.  Neurological:     Mental Status: She is alert.  Psychiatric:        Mood and Affect: Mood normal.        Behavior: Behavior normal.    BP 128/76   Pulse (!) 124   Temp 98.8 F (37.1 C)   Wt 129 lb (58.5 kg)   LMP 08/10/2023 (Exact Date)   SpO2 99%   BMI 22.14 kg/m  Wt Readings from Last 3 Encounters:  08/13/23 129 lb (58.5 kg)  07/31/23 131 lb (59.4 kg)  07/12/23 128 lb (58.1 kg)      Dulce Sellar, NP

## 2023-08-14 DIAGNOSIS — J039 Acute tonsillitis, unspecified: Secondary | ICD-10-CM | POA: Diagnosis not present

## 2023-08-14 DIAGNOSIS — R509 Fever, unspecified: Secondary | ICD-10-CM | POA: Diagnosis not present

## 2023-08-14 DIAGNOSIS — J0391 Acute recurrent tonsillitis, unspecified: Secondary | ICD-10-CM | POA: Diagnosis not present

## 2023-08-14 DIAGNOSIS — J36 Peritonsillar abscess: Secondary | ICD-10-CM | POA: Diagnosis not present

## 2023-08-14 DIAGNOSIS — R590 Localized enlarged lymph nodes: Secondary | ICD-10-CM | POA: Diagnosis not present

## 2023-08-14 DIAGNOSIS — M791 Myalgia, unspecified site: Secondary | ICD-10-CM | POA: Diagnosis not present

## 2023-08-14 DIAGNOSIS — H9201 Otalgia, right ear: Secondary | ICD-10-CM | POA: Diagnosis not present

## 2023-08-14 DIAGNOSIS — R093 Abnormal sputum: Secondary | ICD-10-CM | POA: Diagnosis not present

## 2023-08-14 DIAGNOSIS — R0789 Other chest pain: Secondary | ICD-10-CM | POA: Diagnosis not present

## 2023-08-14 DIAGNOSIS — L0291 Cutaneous abscess, unspecified: Secondary | ICD-10-CM | POA: Diagnosis not present

## 2023-08-15 DIAGNOSIS — J0391 Acute recurrent tonsillitis, unspecified: Secondary | ICD-10-CM | POA: Diagnosis not present

## 2023-08-15 DIAGNOSIS — R093 Abnormal sputum: Secondary | ICD-10-CM | POA: Diagnosis not present

## 2023-08-22 ENCOUNTER — Inpatient Hospital Stay: Payer: Medicaid Other | Admitting: Family

## 2023-08-23 ENCOUNTER — Telehealth: Payer: Medicaid Other | Admitting: Family Medicine

## 2023-08-23 DIAGNOSIS — B3731 Acute candidiasis of vulva and vagina: Secondary | ICD-10-CM | POA: Diagnosis not present

## 2023-08-23 MED ORDER — FLUCONAZOLE 150 MG PO TABS: 150.0000 mg | ORAL_TABLET | Freq: Once | ORAL | 0 refills | Status: AC

## 2023-08-23 NOTE — Progress Notes (Signed)

## 2023-08-27 DIAGNOSIS — J0391 Acute recurrent tonsillitis, unspecified: Secondary | ICD-10-CM | POA: Diagnosis not present

## 2023-08-27 DIAGNOSIS — J36 Peritonsillar abscess: Secondary | ICD-10-CM | POA: Diagnosis not present

## 2023-08-28 ENCOUNTER — Encounter (HOSPITAL_COMMUNITY): Payer: Medicaid Other | Admitting: Student

## 2023-08-29 ENCOUNTER — Encounter (HOSPITAL_COMMUNITY): Payer: Medicaid Other | Admitting: Student

## 2023-09-05 DIAGNOSIS — Z113 Encounter for screening for infections with a predominantly sexual mode of transmission: Secondary | ICD-10-CM | POA: Diagnosis not present

## 2023-09-05 DIAGNOSIS — Z114 Encounter for screening for human immunodeficiency virus [HIV]: Secondary | ICD-10-CM | POA: Diagnosis not present

## 2023-09-06 DIAGNOSIS — Z419 Encounter for procedure for purposes other than remedying health state, unspecified: Secondary | ICD-10-CM | POA: Diagnosis not present

## 2023-09-09 DIAGNOSIS — N898 Other specified noninflammatory disorders of vagina: Secondary | ICD-10-CM | POA: Diagnosis not present

## 2023-10-02 ENCOUNTER — Ambulatory Visit (INDEPENDENT_AMBULATORY_CARE_PROVIDER_SITE_OTHER): Payer: Medicaid Other | Admitting: Family

## 2023-10-02 VITALS — BP 114/78 | HR 106 | Temp 98.0°F | Ht 64.0 in | Wt 134.0 lb

## 2023-10-02 DIAGNOSIS — B379 Candidiasis, unspecified: Secondary | ICD-10-CM | POA: Diagnosis not present

## 2023-10-02 DIAGNOSIS — T3695XA Adverse effect of unspecified systemic antibiotic, initial encounter: Secondary | ICD-10-CM

## 2023-10-02 DIAGNOSIS — J029 Acute pharyngitis, unspecified: Secondary | ICD-10-CM | POA: Diagnosis not present

## 2023-10-02 DIAGNOSIS — J0391 Acute recurrent tonsillitis, unspecified: Secondary | ICD-10-CM

## 2023-10-02 LAB — POCT RAPID STREP A (OFFICE): Rapid Strep A Screen: NEGATIVE

## 2023-10-02 MED ORDER — FLUCONAZOLE 150 MG PO TABS: ORAL_TABLET | ORAL | 0 refills | Status: DC

## 2023-10-02 MED ORDER — PREDNISONE 20 MG PO TABS
ORAL_TABLET | ORAL | 0 refills | Status: DC
Start: 2023-10-03 — End: 2023-11-02

## 2023-10-02 MED ORDER — METHYLPREDNISOLONE ACETATE 80 MG/ML IJ SUSP
80.0000 mg | Freq: Once | INTRAMUSCULAR | Status: AC
Start: 2023-10-02 — End: 2023-10-02
  Administered 2023-10-02: 80 mg via INTRAMUSCULAR

## 2023-10-02 MED ORDER — AMOXICILLIN-POT CLAVULANATE 875-125 MG PO TABS: 1.0000 | ORAL_TABLET | Freq: Two times a day (BID) | ORAL | 0 refills | Status: DC

## 2023-10-02 NOTE — Progress Notes (Signed)
Patient ID: Brandi Church, female    DOB: October 09, 2001, 22 y.o.   MRN: [id number]  Chief Complaint  Patient presents with   Sore Throat    Pt c/o sore throat for 2 days with white spots, Pt states she it is hard to swallow.    Discussed the use of AI scribe software for clinical note transcription with the patient, who gave verbal consent to proceed.  History of Present Illness   The patient, with a history of recurrent tonsillitis, is scheduled for a tonsillectomy. She has been experiencing persistent tonsillar infections, despite multiple courses of antibiotics and steroids. The patient reports that the infections are always localized to the tonsils and have never resulted in a positive swab. She has been treated with IV antibiotics and steroids during hospital visits. The patient also reports neck pain, but denies having a fever. She has noticed swollen lymph nodes. The patient has seen an ENT specialist in the past, most recently in October, during which she was not experiencing symptoms as she had recently completed a course of antibiotics following an ER visit for an abscess. The patient was given a steroid shot during that visit. The patient was also given antibiotics to take home after a hospital visit.      Assessment & Plan:  1. Sore throat - rapid strep negative.  - POCT rapid strep A  2. Acute recurrent tonsillitis Recurrent episodes with recent abscess formation in early October. Patient is scheduled for surgery in December. No fever, but sore throat, exudate & edema noted bilaterally on tonsils, and lymphadenopathy present. Advised on warm salt water gargles tid, No Ibuprofen or Aleve while on prednisone.  RTO precautions provided.  - methylPREDNISolone acetate (DEPO-MEDROL) injection 80 mg - predniSONE (DELTASONE) 20 MG tablet; Start Thursday am. Take 2 pills in the morning with breakfast for 3 days, then 1 pill for 2 days  Dispense: 8 tablet; Refill: 0 - amoxicillin-clavulanate  (AUGMENTIN) 875-125 MG tablet; Take 1 tablet by mouth 2 (two) times daily after a meal.  Dispense: 14 tablet; Refill: 0  3. Antibiotic-induced yeast infection -  - fluconazole (DIFLUCAN) 150 MG tablet; Take 1 pill in 2 days and 2nd pill on last day of Augmentin.  Dispense: 2 tablet; Refill: 0   Subjective:    Outpatient Medications Prior to Visit  Medication Sig Dispense Refill   etonogestrel-ethinyl estradiol (NUVARING) 0.12-0.015 MG/24HR vaginal ring Place 1 each vaginally every 28 (twenty-eight) days.     ferrous sulfate 325 (65 FE) MG EC tablet Take 1 tablet (325 mg total) by mouth daily with breakfast. 90 tablet 1   fluticasone (FLOVENT HFA) 110 MCG/ACT inhaler Inhale 2 puffs into the lungs daily as needed (asthma).     ibuprofen (ADVIL) 800 MG tablet Take 1 tablet (800 mg total) by mouth every 8 (eight) hours as needed for moderate pain, fever or headache (Sore throat. Take after eating.). Ok to break tab in half if hard to swallow. 30 tablet 0   LORazepam (ATIVAN) 1 MG tablet Take 1 tablet (1 mg total) by mouth daily as needed for anxiety or sedation. 10 tablet 0   ondansetron (ZOFRAN) 4 MG tablet Take 1 tablet (4 mg total) by mouth every 6 (six) hours. 12 tablet 0   VENTOLIN HFA 108 (90 Base) MCG/ACT inhaler Inhale 2 puffs into the lungs every 4 (four) hours as needed for shortness of breath.     escitalopram (LEXAPRO) 10 MG tablet Take 1 tablet (10 mg total)  by mouth daily. 30 tablet 1   lidocaine (XYLOCAINE) 2 % solution Use as directed 15 mLs in the mouth or throat every 3 (three) hours as needed for mouth pain (gargle; may spit or swallow). 100 mL 0   No facility-administered medications prior to visit.   Past Medical History:  Diagnosis Date   ADHD (attention deficit hyperactivity disorder)    Anemia    Asthma    Complication of anesthesia    Depression    GAD (generalized anxiety disorder)    Headache disorder 06/22/2020   History of RSV infection 10/20/2022   ED visit  in epic positive test and dx viral tonsillitis   History of tonsillitis 11/24/2022   (12-25-2022 per pt symptoms completely resolved)  ED visit in epic, can't breath or swallow due to enlarged tonsill's , dx acute pharyngitis;   in epic per  PCP note 11-26-2022  dx tonsillitis w/ exudate , given prednisone pack   MDD (major depressive disorder)    12/28/22   MDD (major depressive disorder), recurrent episode, moderate (HCC) 01/19/2021   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/26/2023   PONV (postoperative nausea and vomiting)    Supervision of normal first pregnancy 08/16/2021              Nursing Staff    Provider      Office Location     [facility]    Dating     LMP      Language      ENGLISH    Anatomy US     [facility], resolved on follow-up      Flu Vaccine     08/14/2021    Genetic/Carrier Screen     NIPS: low risk  AFP:   Screen neg  Horizon: negative      TDaP Vaccine      12/20/2021    Hgb A1C or   GTT    Early   Third trimester 74-92-84      COVID Vaccine     YES         L   Unintended weight loss 10/08/2022   Past Surgical History:  Procedure Laterality Date   LABIOPLASTY N/A 12/31/2022   Procedure: LABIAPLASTY;  Surgeon: Osborn Coho, MD;  Location: [facility] OR;  Service: Gynecology;  Laterality: N/A;   NO PAST SURGERIES     WISDOM TOOTH EXTRACTION     removed on 08/16/2022.  11/28/2021   Allergies  Allergen Reactions   Latex Itching and Swelling    Reports swelling of mouth and itching      Objective:    Physical Exam Vitals and nursing note reviewed.  Constitutional:      Appearance: Normal appearance.  HENT:     Mouth/Throat:     Mouth: Mucous membranes are moist.     Pharynx: Pharyngeal swelling and posterior oropharyngeal erythema present.     Tonsils: Tonsillar exudate present. No tonsillar abscesses. 3+ on the right. 3+ on the left.  Cardiovascular:     Rate and Rhythm: Normal rate and regular rhythm.  Pulmonary:     Effort: Pulmonary effort is normal.     Breath  sounds: Normal breath sounds.  Musculoskeletal:        General: Normal range of motion.  Lymphadenopathy:     Head:     Right side of head: Tonsillar adenopathy present. No preauricular, posterior auricular or occipital adenopathy.     Left side of head: No tonsillar, preauricular, posterior auricular or  occipital adenopathy.     Cervical: Cervical adenopathy present.     Right cervical: Superficial cervical adenopathy present.  Skin:    General: Skin is warm and dry.  Neurological:     Mental Status: She is alert.  Psychiatric:        Mood and Affect: Mood normal.        Behavior: Behavior normal.    BP 114/78 (BP Location: Left Arm, Patient Position: Sitting, Cuff Size: Normal)   Pulse (!) 106   Temp 98 F (36.7 C) (Temporal)   Ht 5\' 4"  (1.626 m)   Wt 134 lb (60.8 kg)   LMP 09/20/2023 (Exact Date)   SpO2 100%   Breastfeeding No   BMI 23.00 kg/m  Wt Readings from Last 3 Encounters:  10/02/23 134 lb (60.8 kg)  08/13/23 129 lb (58.5 kg)  07/31/23 131 lb (59.4 kg)       Dulce Sellar, NP

## 2023-10-06 DIAGNOSIS — Z419 Encounter for procedure for purposes other than remedying health state, unspecified: Secondary | ICD-10-CM | POA: Diagnosis not present

## 2023-10-12 ENCOUNTER — Ambulatory Visit
Admission: EM | Admit: 2023-10-12 | Discharge: 2023-10-12 | Disposition: A | Discharge status: Home / Self Care | Payer: Medicaid Other | Attending: Family Medicine | Admitting: Family Medicine

## 2023-10-12 DIAGNOSIS — N760 Acute vaginitis: Secondary | ICD-10-CM | POA: Insufficient documentation

## 2023-10-12 LAB — POCT URINE PREGNANCY: Preg Test, Ur: NEGATIVE

## 2023-10-12 MED ORDER — FLUCONAZOLE 150 MG PO TABS
150.0000 mg | ORAL_TABLET | ORAL | 0 refills | Status: DC
Start: 2023-10-12 — End: 2023-12-01

## 2023-10-12 NOTE — ED Provider Notes (Signed)
[facility]  Note:  This document was prepared using Conservation officer, historic buildings and may include unintentional dictation errors.  MRN: [id number] DOB: 10-06-2001  Subjective:   Brandi Church is a 21 y.o. female presenting for acute onset today of yellow irritating vaginal discharge.  Patient has a history of yeast infections.  She is currently taking Augmentin.  No urinary symptoms.  Uses Nuva ring.  No known exposures.  No current facility-administered medications for this encounter.  Current Outpatient Medications:    amoxicillin-clavulanate (AUGMENTIN) 875-125 MG tablet, Take 1 tablet by mouth 2 (two) times daily after a meal., Disp: 14 tablet, Rfl: 0   escitalopram (LEXAPRO) 10 MG tablet, Take 1 tablet (10 mg total) by mouth daily., Disp: 30 tablet, Rfl: 1   etonogestrel-ethinyl estradiol (NUVARING) 0.12-0.015 MG/24HR vaginal ring, Place 1 each vaginally every 28 (twenty-eight) days., Disp: , Rfl:    ferrous sulfate 325 (65 FE) MG EC tablet, Take 1 tablet (325 mg total) by mouth daily with breakfast., Disp: 90 tablet, Rfl: 1   fluconazole (DIFLUCAN) 150 MG tablet, Take 1 pill in 2 days and 2nd pill on last day of Augmentin., Disp: 2 tablet, Rfl: 0   fluticasone (FLOVENT HFA) 110 MCG/ACT inhaler, Inhale 2 puffs into the lungs daily as needed (asthma)., Disp: , Rfl:    ibuprofen (ADVIL) 800 MG tablet, Take 1 tablet (800 mg total) by mouth every 8 (eight) hours as needed for moderate pain, fever or headache (Sore throat. Take after eating.). Ok to break tab in half if hard to swallow., Disp: 30 tablet, Rfl: 0   LORazepam (ATIVAN) 1 MG tablet, Take 1 tablet (1 mg total) by mouth daily as needed for anxiety or sedation., Disp: 10 tablet, Rfl: 0   ondansetron (ZOFRAN) 4 MG tablet, Take 1 tablet (4 mg total) by mouth every 6 (six) hours., Disp: 12 tablet, Rfl: 0   predniSONE (DELTASONE) 20 MG tablet, Start Thursday am. Take 2 pills in the morning with breakfast for 3  days, then 1 pill for 2 days, Disp: 8 tablet, Rfl: 0   VENTOLIN HFA 108 (90 Base) MCG/ACT inhaler, Inhale 2 puffs into the lungs every 4 (four) hours as needed for shortness of breath., Disp: , Rfl:    Allergies  Allergen Reactions   Latex Itching and Swelling    Reports swelling of mouth and itching    Past Medical History:  Diagnosis Date   ADHD (attention deficit hyperactivity disorder)    Anemia    Asthma    Complication of anesthesia    Depression    GAD (generalized anxiety disorder)    Headache disorder 06/22/2020   History of RSV infection 10/20/2022   ED visit in epic positive test and dx viral tonsillitis   History of tonsillitis 11/24/2022   (12-25-2022 per pt symptoms completely resolved)  ED visit in epic, can't breath or swallow due to enlarged tonsill's , dx acute pharyngitis;   in epic per  PCP note 11-26-2022  dx tonsillitis w/ exudate , given prednisone pack   MDD (major depressive disorder)    12/28/22   MDD (major depressive disorder), recurrent episode, moderate (HCC) 01/19/2021   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/26/2023   PONV (postoperative nausea and vomiting)    Supervision of normal first pregnancy 08/16/2021              Nursing Staff    Provider      Office Location  [facility]    Dating     LMP      Language      ENGLISH    Anatomy US     CPC, resolved on follow-up      Flu Vaccine     08/14/2021    Genetic/Carrier Screen     NIPS: low risk  AFP:   Screen neg  Horizon: negative      TDaP Vaccine      12/20/2021    Hgb A1C or   GTT    Early   Third trimester 74-92-84      COVID Vaccine     YES         L   Unintended weight loss 10/08/2022     Past Surgical History:  Procedure Laterality Date   LABIOPLASTY N/A 12/31/2022   Procedure: LABIAPLASTY;  Surgeon: Osborn Coho, MD;  Location: [facility] OR;  Service: Gynecology;  Laterality: N/A;   NO PAST SURGERIES     WISDOM TOOTH EXTRACTION     removed on 08/16/2022.  11/28/2021    Family History   Problem Relation Age of Onset   Depression Mother    Asthma Mother    Depression Father    Asthma Father    Hyperlipidemia Maternal Grandmother    Heart disease Maternal Grandmother    Hypertension Maternal Grandmother    Hyperlipidemia Maternal Grandfather    Heart disease Maternal Grandfather    COPD Maternal Grandfather    Hypertension Maternal Grandfather    Diabetes Maternal Grandfather     Social History   Tobacco Use   Smoking status: Never    Passive exposure: Never   Smokeless tobacco: Never  Vaping Use   Vaping status: Never Used  Substance Use Topics   Alcohol use: Never   Drug use: Never    ROS   Objective:   Vitals: BP 124/86 (BP Location: Right Arm)   Pulse 92   Temp 99.6 F (37.6 C) (Oral)   Resp 16   LMP 09/25/2023 (Approximate)   SpO2 98%   Breastfeeding No   Physical Exam Constitutional:      General: She is not in acute distress.    Appearance: Normal appearance. She is well-developed. She is not ill-appearing, toxic-appearing or diaphoretic.  HENT:     Head: Normocephalic and atraumatic.     Nose: Nose normal.     Mouth/Throat:     Mouth: Mucous membranes are moist.  Eyes:     General: No scleral icterus.       Right eye: No discharge.        Left eye: No discharge.     Extraocular Movements: Extraocular movements intact.     Conjunctiva/sclera: Conjunctivae normal.  Cardiovascular:     Rate and Rhythm: Normal rate.  Pulmonary:     Effort: Pulmonary effort is normal.  Abdominal:     General: Bowel sounds are normal. There is no distension.     Palpations: Abdomen is soft. There is no mass.     Tenderness: There is no abdominal tenderness. There is no right CVA tenderness, left CVA tenderness, guarding or rebound.  Skin:    General: Skin is warm and dry.  Neurological:     General: No focal deficit present.     Mental Status: She is alert and oriented to person, place, and time.  Psychiatric:        Mood and Affect: Mood  normal.        Behavior:  Behavior normal.        Thought Content: Thought content normal.        Judgment: Judgment normal.     Results for orders placed or performed during the hospital encounter of 10/12/23 (from the past 24 hour(s))  POCT urine pregnancy     Status: None   Collection Time: 10/12/23 11:23 AM  Result Value Ref Range   Preg Test, Ur Negative Negative    Assessment and Plan :   PDMP not reviewed this encounter.  1. Acute vaginitis    Will treat empirically for yeast vaginitis.  Labs pending, will treat as appropriate otherwise.  Counseled patient on potential for adverse effects with medications prescribed/recommended today, ER and return-to-clinic precautions discussed, patient verbalized understanding.    Wallis Bamberg, PA-C 10/12/23 1131

## 2023-10-12 NOTE — ED Triage Notes (Signed)
Pt requested STD's test. Pt reports yellow vaginal discharge started today.

## 2023-10-14 ENCOUNTER — Telehealth (HOSPITAL_COMMUNITY): Payer: Self-pay

## 2023-10-14 LAB — CERVICOVAGINAL ANCILLARY ONLY
Bacterial Vaginitis (gardnerella): POSITIVE — AB
Candida Glabrata: NEGATIVE
Candida Vaginitis: NEGATIVE
Chlamydia: POSITIVE — AB
Comment: NEGATIVE
Comment: NEGATIVE
Comment: NEGATIVE
Comment: NEGATIVE
Comment: NEGATIVE
Comment: NORMAL
Neisseria Gonorrhea: NEGATIVE
Trichomonas: NEGATIVE

## 2023-10-14 MED ORDER — DOXYCYCLINE HYCLATE 100 MG PO CAPS: 100.0000 mg | ORAL_CAPSULE | Freq: Two times a day (BID) | ORAL | 0 refills | Status: DC

## 2023-10-14 MED ORDER — METRONIDAZOLE 0.75 % VA GEL
1.0000 | Freq: Every day | VAGINAL | 0 refills | Status: AC
Start: 2023-10-14 — End: 2023-10-19

## 2023-10-14 NOTE — Telephone Encounter (Signed)
Per protocol, pt requires tx with metronidazole and Doxycycline.  Reviewed with patient, verified pharmacy, prescription sent.

## 2023-10-15 DIAGNOSIS — Z113 Encounter for screening for infections with a predominantly sexual mode of transmission: Secondary | ICD-10-CM | POA: Diagnosis not present

## 2023-10-28 DIAGNOSIS — J0391 Acute recurrent tonsillitis, unspecified: Secondary | ICD-10-CM | POA: Diagnosis not present

## 2023-10-28 DIAGNOSIS — J36 Peritonsillar abscess: Secondary | ICD-10-CM | POA: Diagnosis not present

## 2023-10-28 DIAGNOSIS — J3501 Chronic tonsillitis: Secondary | ICD-10-CM | POA: Diagnosis not present

## 2023-11-02 ENCOUNTER — Other Ambulatory Visit: Payer: Self-pay

## 2023-11-02 ENCOUNTER — Encounter (HOSPITAL_BASED_OUTPATIENT_CLINIC_OR_DEPARTMENT_OTHER): Payer: Self-pay

## 2023-11-02 ENCOUNTER — Emergency Department (HOSPITAL_COMMUNITY)
Admission: EM | Admit: 2023-11-02 | Discharge: 2023-11-02 | Discharge status: Against Medical Advice | Payer: Medicaid Other

## 2023-11-02 ENCOUNTER — Emergency Department (HOSPITAL_BASED_OUTPATIENT_CLINIC_OR_DEPARTMENT_OTHER)
Admission: EM | Admit: 2023-11-02 | Discharge: 2023-11-02 | Disposition: A | Discharge status: Home / Self Care | Payer: Medicaid Other | Attending: Emergency Medicine | Admitting: Emergency Medicine

## 2023-11-02 DIAGNOSIS — E871 Hypo-osmolality and hyponatremia: Secondary | ICD-10-CM | POA: Insufficient documentation

## 2023-11-02 DIAGNOSIS — G8918 Other acute postprocedural pain: Secondary | ICD-10-CM | POA: Insufficient documentation

## 2023-11-02 DIAGNOSIS — J029 Acute pharyngitis, unspecified: Secondary | ICD-10-CM | POA: Diagnosis not present

## 2023-11-02 DIAGNOSIS — D649 Anemia, unspecified: Secondary | ICD-10-CM | POA: Diagnosis not present

## 2023-11-02 DIAGNOSIS — E876 Hypokalemia: Secondary | ICD-10-CM | POA: Insufficient documentation

## 2023-11-02 DIAGNOSIS — D509 Iron deficiency anemia, unspecified: Secondary | ICD-10-CM

## 2023-11-02 DIAGNOSIS — Z91040 Latex allergy status: Secondary | ICD-10-CM | POA: Insufficient documentation

## 2023-11-02 DIAGNOSIS — R110 Nausea: Secondary | ICD-10-CM | POA: Diagnosis present

## 2023-11-02 LAB — BASIC METABOLIC PANEL
Anion gap: 12 (ref 5–15)
BUN: 6 mg/dL (ref 6–20)
CO2: 21 mmol/L — ABNORMAL LOW (ref 22–32)
Calcium: 8.4 mg/dL — ABNORMAL LOW (ref 8.9–10.3)
Chloride: 101 mmol/L (ref 98–111)
Creatinine, Ser: 0.61 mg/dL (ref 0.44–1.00)
GFR, Estimated: >60 mL/min (ref >60)
Glucose, Bld: 93 mg/dL (ref 70–99)
Potassium: 3.3 mmol/L — ABNORMAL LOW (ref 3.5–5.1)
Sodium: 134 mmol/L — ABNORMAL LOW (ref 135–145)

## 2023-11-02 LAB — CBC WITH DIFFERENTIAL/PLATELET
Abs Immature Granulocytes: 0.01 10*3/uL (ref 0.00–0.07)
Basophils Absolute: 0 10*3/uL (ref 0.0–0.1)
Basophils Relative: 1 %
Eosinophils Absolute: 0 10*3/uL (ref 0.0–0.5)
Eosinophils Relative: 1 %
HCT: 34.5 % — ABNORMAL LOW (ref 36.0–46.0)
Hemoglobin: 10.8 g/dL — ABNORMAL LOW (ref 12.0–15.0)
Immature Granulocytes: 0 %
Lymphocytes Relative: 26 %
Lymphs Abs: 1.1 10*3/uL (ref 0.7–4.0)
MCH: 23 pg — ABNORMAL LOW (ref 26.0–34.0)
MCHC: 31.3 g/dL (ref 30.0–36.0)
MCV: 73.6 fL — ABNORMAL LOW (ref 80.0–100.0)
Monocytes Absolute: 0.4 10*3/uL (ref 0.1–1.0)
Monocytes Relative: 10 %
Neutro Abs: 2.6 10*3/uL (ref 1.7–7.7)
Neutrophils Relative %: 62 %
Platelets: 287 10*3/uL (ref 150–400)
RBC: 4.69 MIL/uL (ref 3.87–5.11)
RDW: 15.8 % — ABNORMAL HIGH (ref 11.5–15.5)
WBC: 4.1 10*3/uL (ref 4.0–10.5)
nRBC: 0 % (ref 0.0–0.2)

## 2023-11-02 MED ORDER — KETOROLAC TROMETHAMINE 30 MG/ML IJ SOLN
30.0000 mg | Freq: Once | INTRAMUSCULAR | Status: AC
  Administered 2023-11-02: 30 mg via INTRAVENOUS
  Filled 2023-11-02: qty 1

## 2023-11-02 MED ORDER — IBUPROFEN 800 MG PO TABS
800.0000 mg | ORAL_TABLET | Freq: Once | ORAL | Status: AC
  Administered 2023-11-02: 800 mg via ORAL
  Filled 2023-11-02: qty 1

## 2023-11-02 MED ORDER — MORPHINE SULFATE (PF) 4 MG/ML IV SOLN
4.0000 mg | Freq: Once | INTRAVENOUS | Status: AC
  Administered 2023-11-02: 4 mg via INTRAVENOUS
  Filled 2023-11-02: qty 1

## 2023-11-02 MED ORDER — OXYCODONE HCL 5 MG PO TABS: 5.0000 mg | ORAL_TABLET | ORAL | 0 refills | Status: DC | PRN

## 2023-11-02 MED ORDER — SODIUM CHLORIDE 0.9 % IV BOLUS
1000.0000 mL | Freq: Once | INTRAVENOUS | Status: AC
  Administered 2023-11-02: 1000 mL via INTRAVENOUS

## 2023-11-02 MED ORDER — LIDOCAINE HCL (PF) 2% IJ FOR NEBU
5.0000 mL | Freq: Once | RESPIRATORY_TRACT | Status: AC
  Administered 2023-11-02: 5 mL via RESPIRATORY_TRACT
  Filled 2023-11-02: qty 5

## 2023-11-02 MED ORDER — ONDANSETRON HCL 4 MG/2ML IJ SOLN
4.0000 mg | Freq: Once | INTRAMUSCULAR | Status: AC
  Administered 2023-11-02: 4 mg via INTRAVENOUS
  Filled 2023-11-02: qty 2

## 2023-11-02 NOTE — ED Notes (Signed)
No answer from patient , called multiple times .

## 2023-11-02 NOTE — Discharge Instructions (Addendum)
Drink plenty of fluids.  Continue taking acetaminophen and ibuprofen as needed for pain.  For pain not controlled by acetaminophen and ibuprofen, add oxycodone.  Return to the emergency department if pain is not being adequately controlled at home.

## 2023-11-02 NOTE — ED Notes (Signed)
Unable to locate patient at waiting area twice.

## 2023-11-02 NOTE — ED Provider Notes (Signed)
[place] EMERGENCY DEPARTMENT AT [facility] Provider Note   CSN: [id number] Arrival date & time: 11/02/23  0159     History  Chief Complaint  Patient presents with   Sore Throat    Brandi Church is a 22 y.o. female.  The history is provided by the patient.  Sore Throat  She had tonsillectomy 4 days ago and comes in because of severe throat pain and inability to swallow.  She states she has had a low-grade fever as high as 99.8.  She endorses mild nausea but no vomiting.   Home Medications Prior to Admission medications   Medication Sig Start Date End Date Taking? Authorizing Provider  amoxicillin-clavulanate (AUGMENTIN) 875-125 MG tablet Take 1 tablet by mouth 2 (two) times daily after a meal. 10/02/23   Dulce Sellar, NP  doxycycline (VIBRAMYCIN) 100 MG capsule Take 1 capsule (100 mg total) by mouth 2 (two) times daily. 10/14/23   Merrilee Jansky, MD  escitalopram (LEXAPRO) 10 MG tablet Take 1 tablet (10 mg total) by mouth daily. 07/26/23 09/24/23  Princess Bruins, DO  etonogestrel-ethinyl estradiol (NUVARING) 0.12-0.015 MG/24HR vaginal ring Place 1 each vaginally every 28 (twenty-eight) days. 10/01/22   [provider]  ferrous sulfate 325 (65 FE) MG EC tablet Take 1 tablet (325 mg total) by mouth daily with breakfast. 08/08/23   Dulce Sellar, NP  fluconazole (DIFLUCAN) 150 MG tablet Take 1 tablet (150 mg total) by mouth once a week. 10/12/23   Wallis Bamberg, PA-C  fluticasone (FLOVENT HFA) 110 MCG/ACT inhaler Inhale 2 puffs into the lungs daily as needed (asthma).    [provider]  ibuprofen (ADVIL) 800 MG tablet Take 1 tablet (800 mg total) by mouth every 8 (eight) hours as needed for moderate pain, fever or headache (Sore throat. Take after eating.). Ok to break tab in half if hard to swallow. 08/13/23   Dulce Sellar, NP  LORazepam (ATIVAN) 1 MG tablet Take 1 tablet (1 mg total) by mouth daily as needed for anxiety or sedation. 07/12/23    Dulce Sellar, NP  ondansetron (ZOFRAN) 4 MG tablet Take 1 tablet (4 mg total) by mouth every 6 (six) hours. 08/12/23   Horton, Clabe Seal, DO  predniSONE (DELTASONE) 20 MG tablet Start Thursday am. Take 2 pills in the morning with breakfast for 3 days, then 1 pill for 2 days 10/03/23   Dulce Sellar, NP  VENTOLIN HFA 108 (90 Base) MCG/ACT inhaler Inhale 2 puffs into the lungs every 4 (four) hours as needed for shortness of breath. 06/23/22   [provider]      Allergies    Latex    Review of Systems   Review of Systems  All other systems reviewed and are negative.   Physical Exam Updated Vital Signs BP (!) 147/92 (BP Location: Right Arm)   Pulse 95   Temp (!) 100.4 F (38 C) (Oral)   Resp 18   Wt 58.1 kg   LMP 09/25/2023 (Approximate)   SpO2 100%   BMI 21.97 kg/m  Physical Exam Vitals and nursing note reviewed.   22 year old female, appears mildly uncomfortable, but is in no acute distress. Vital signs are significant for elevated blood pressure and borderline elevated temperature. Oxygen saturation is 100%, which is normal. Head is normocephalic and atraumatic. PERRLA, EOMI. Oropharynx shows moderate edema of the uvula, exudate present in the tonsillar fossae.  There is no pooling of secretions. Neck is nontender and supple without adenopathy. Lungs are clear  without rales, wheezes, or rhonchi. Chest is nontender. Heart has regular rate and rhythm with 2/6 systolic ejection murmur best heard at the upper left sternal border. Abdomen is soft, flat, nontender. Neurologic: Awake and alert.  Moves all extremities equally.  ED Results / Procedures / Treatments   Labs (all labs ordered are listed, but only abnormal results are displayed) Labs Reviewed  BASIC METABOLIC PANEL - Abnormal; Notable for the following components:      Result Value   Sodium 134 (*)    Potassium 3.3 (*)    CO2 21 (*)    Calcium 8.4 (*)    All other components within normal limits   CBC WITH DIFFERENTIAL/PLATELET - Abnormal; Notable for the following components:   Hemoglobin 10.8 (*)    HCT 34.5 (*)    MCV 73.6 (*)    MCH 23.0 (*)    RDW 15.8 (*)    All other components within normal limits   Procedures Procedures    Medications Ordered in ED Medications  ibuprofen (ADVIL) tablet 800 mg (800 mg Oral Given 11/02/23 0220)  sodium chloride 0.9 % bolus 1,000 mL (0 mLs Intravenous Stopped 11/02/23 0403)  morphine (PF) 4 MG/ML injection 4 mg (4 mg Intravenous Given 11/02/23 0301)  ondansetron (ZOFRAN) injection 4 mg (4 mg Intravenous Given 11/02/23 0301)  lidocaine 2% "Nebulized" 5 mL (5 mLs Nebulization Given 11/02/23 0307)  ketorolac (TORADOL) 30 MG/ML injection 30 mg (30 mg Intravenous Given 11/02/23 0424)  morphine (PF) 4 MG/ML injection 4 mg (4 mg Intravenous Given 11/02/23 0425)    ED Course/ Medical Decision Making/ A&P                                 Medical Decision Making Amount and/or Complexity of Data Reviewed Labs: ordered.  Risk Prescription drug management.   Throat pain post tonsillectomy.  Exam seems appropriate for 4 days postop tonsillectomy.  I suspect that she is somewhat dehydrated, I have ordered IV fluids as well as intravenous morphine and ondansetron.  I have also ordered nebulizer treatment with lidocaine.  I have ordered screening labs of CBC and basic metabolic panel.  I have reviewed her past records, and note surgery for bilateral tonsillectomy on 10/28/2023.  I have reviewed her laboratory tests, and my interpretation is mild hyponatremia and hypokalemia which should improve with resuming normal diet, mild microcytic anemia unchanged from baseline.  She had only modest improvement with above-noted treatment.  I ordered an additional dose of morphine and a dose of ketorolac.  Following that, she noted significant improvement.  I am discharging her with instructions to drink plenty of fluids, continue taking acetaminophen and  ibuprofen for pain, increase oxycodone dose to 5-10 mg as needed for pain not relieved by ibuprofen and acetaminophen.  I have given her a prescription for some more oxycodone tablets.  Return if symptoms or not being adequately controlled at home.  Final Clinical Impression(s) / ED Diagnoses Final diagnoses:  Post-tonsillectomy pain  Hyponatremia  Hypokalemia  Microcytic anemia    Rx / DC Orders ED Discharge Orders          Ordered    oxyCODONE (ROXICODONE) 5 MG immediate release tablet  Every 4 hours PRN,   Status:  Discontinued        11/02/23 0531    oxyCODONE (ROXICODONE) 5 MG immediate release tablet  Every 4 hours PRN  11/02/23 0533              Dione Booze, MD 11/02/23 [phone]

## 2023-11-02 NOTE — ED Triage Notes (Signed)
Pov from home/ tonsilectomy on 12/23/ severe pain in throat/ unable to swallow, denies presence of blood/ nausea/ taking oxycodone with no pain relief/ has not spoke with ENT/ pt is moaning and crying in triage/ father answering questions for pt/ pt ambulatory/

## 2023-11-06 DIAGNOSIS — Z419 Encounter for procedure for purposes other than remedying health state, unspecified: Secondary | ICD-10-CM | POA: Diagnosis not present

## 2023-11-10 DIAGNOSIS — A64 Unspecified sexually transmitted disease: Secondary | ICD-10-CM | POA: Diagnosis not present

## 2023-11-10 DIAGNOSIS — B3731 Acute candidiasis of vulva and vagina: Secondary | ICD-10-CM | POA: Diagnosis not present

## 2023-11-13 ENCOUNTER — Telehealth (HOSPITAL_COMMUNITY): Payer: Self-pay | Admitting: Clinical

## 2023-11-27 ENCOUNTER — Other Ambulatory Visit: Payer: Self-pay

## 2023-11-27 NOTE — Progress Notes (Deleted)
Brandi Amy, PA-C [address]  [address]  [place], [place] [address]  Main: [phone]  Fax: [phone]   Gastroenterology Consultation  Referring Provider:     Dulce Sellar, NP Primary Care Physician:  Dulce Sellar, NP Primary Gastroenterologist:  *** Reason for Consultation:     Chronic Constipation, Blood in Stool        HPI:   Brandi Church is a 23 y.o. y/o female referred for consultation & management  by Dulce Sellar, NP.    Here to evaluate constipation and blood in stool.  No previous GI evaluation or colonoscopy.  Labs 08/2023: Microcytic anemia with hemoglobin 9.9, hematocrit 32.9, MCV 77.  History of chronic iron deficiency anemia.  Hemoglobin 10.0 in 2023.  Currently on iron tablet daily. 10/2023: Pregnancy test negative.  Tonsillectomy.  Past Medical History:  Diagnosis Date   ADHD (attention deficit hyperactivity disorder)    Anemia    Asthma    Complication of anesthesia    Depression    GAD (generalized anxiety disorder)    Headache disorder 06/22/2020   History of RSV infection 10/20/2022   ED visit in epic positive test and dx viral tonsillitis   History of tonsillitis 11/24/2022   (12-25-2022 per pt symptoms completely resolved)  ED visit in epic, can't breath or swallow due to enlarged tonsill's , dx acute pharyngitis;   in epic per  PCP note 11-26-2022  dx tonsillitis w/ exudate , given prednisone pack   MDD (major depressive disorder)    12/28/22   MDD (major depressive disorder), recurrent episode, moderate (HCC) 01/19/2021   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/26/2023   PONV (postoperative nausea and vomiting)    Supervision of normal first pregnancy 08/16/2021              Nursing Staff    Provider      Office Location     [facility]    Dating     LMP      Language      ENGLISH    Anatomy US     CPC, resolved on follow-up      Flu Vaccine     08/14/2021    Genetic/Carrier Screen     NIPS: low risk  AFP:   Screen  neg  Horizon: negative      TDaP Vaccine      12/20/2021    Hgb A1C or   GTT    Early   Third trimester 74-92-84      COVID Vaccine     YES         L   Unintended weight loss 10/08/2022    Past Surgical History:  Procedure Laterality Date   LABIOPLASTY N/A 12/31/2022   Procedure: LABIAPLASTY;  Surgeon: Osborn Coho, MD;  Location: [facility] OR;  Service: Gynecology;  Laterality: N/A;   NO PAST SURGERIES     TONSILLECTOMY     WISDOM TOOTH EXTRACTION     removed on 08/16/2022.  11/28/2021    Prior to Admission medications   Medication Sig Start Date End Date Taking? Authorizing Provider  amoxicillin-clavulanate (AUGMENTIN) 125-31.25 MG/5ML suspension  08/14/23   [provider]  escitalopram (LEXAPRO) 10 MG tablet Take 1 tablet (10 mg total) by mouth daily. 07/26/23 09/24/23  Princess Bruins, DO  ferrous sulfate 325 (65 FE) MG EC tablet Take 1 tablet (325 mg total) by mouth daily with breakfast. 08/08/23   Dulce Sellar, NP  fluconazole (DIFLUCAN) 150 MG tablet  Take 1 tablet (150 mg total) by mouth once a week. 10/12/23   Wallis Bamberg, PA-C  fluticasone (FLOVENT HFA) 110 MCG/ACT inhaler Inhale 2 puffs into the lungs daily as needed (asthma).    [provider]  hydrOXYzine (ATARAX) 10 MG tablet Take by mouth. 04/11/21   [provider]  ibuprofen (ADVIL) 800 MG tablet Take 1 tablet (800 mg total) by mouth every 8 (eight) hours as needed for moderate pain, fever or headache (Sore throat. Take after eating.). Ok to break tab in half if hard to swallow. 08/13/23   Dulce Sellar, NP  lisdexamfetamine (VYVANSE) 40 MG capsule     [provider]  LORazepam (ATIVAN) 1 MG tablet Take 1 tablet (1 mg total) by mouth daily as needed for anxiety or sedation. 07/12/23   Dulce Sellar, NP  norethindrone (AYGESTIN) 5 MG tablet Take 5 mg by mouth 3 (three) times daily. 09/20/23   [provider]  ondansetron (ZOFRAN) 4 MG tablet Take 1 tablet (4 mg total) by mouth  every 6 (six) hours. 08/12/23   Horton, Clabe Seal, DO  oxyCODONE (ROXICODONE) 5 MG immediate release tablet Take 1-2 tablets (5-10 mg total) by mouth every 4 (four) hours as needed for severe pain (pain score 7-10). 11/02/23   Dione Booze, MD  traZODone (DESYREL) 50 MG tablet TAKE 1/2 TO 1 TABLET (25 TO 50 MG TOTAL) BY MOUTH AT BEDTIME 09/04/22   [provider]  VENTOLIN HFA 108 (90 Base) MCG/ACT inhaler Inhale 2 puffs into the lungs every 4 (four) hours as needed for shortness of breath. 06/23/22   [provider]    Family History  Problem Relation Age of Onset   Depression Mother    Asthma Mother    Depression Father    Asthma Father    Hyperlipidemia Maternal Grandmother    Heart disease Maternal Grandmother    Hypertension Maternal Grandmother    Hyperlipidemia Maternal Grandfather    Heart disease Maternal Grandfather    COPD Maternal Grandfather    Hypertension Maternal Grandfather    Diabetes Maternal Grandfather      Social History   Tobacco Use   Smoking status: Never    Passive exposure: Never   Smokeless tobacco: Never  Vaping Use   Vaping status: Never Used  Substance Use Topics   Alcohol use: Never   Drug use: Never    Allergies as of 11/28/2023 - Review Complete 11/02/2023  Allergen Reaction Noted   Latex Itching and Swelling 12/06/2020    Review of Systems:    All systems reviewed and negative except where noted in HPI.   Physical Exam:  There were no vitals taken for this visit. No LMP recorded.  General:   Alert,  Well-developed, well-nourished, pleasant and cooperative in NAD Lungs:  Respirations even and unlabored.  Clear throughout to auscultation.   No wheezes, crackles, or rhonchi. No acute distress. Heart:  Regular rate and rhythm; no murmurs, clicks, rubs, or gallops. Abdomen:  Normal bowel sounds.  No bruits.  Soft, and non-distended without masses, hepatosplenomegaly or hernias noted.  No Tenderness.  No guarding or  rebound tenderness.    Neurologic:  Alert and oriented x3;  grossly normal neurologically. Psych:  Alert and cooperative. Normal mood and affect.  Imaging Studies: No results found.  Assessment and Plan:   Brandi Church is a 23 y.o. y/o female has been referred for   1.  Constipation  MiraLAX  2.  Blood in stool  Hydrocortisone suppositories  Scheduling Colonoscopy I discussed risks of colonoscopy with patient to include risk of bleeding, colon perforation, and risk of sedation.  Patient expressed understanding and agrees to proceed with colonoscopy.   3.  Chronic iron deficiency anemia  Labs: CBC, iron panel, ferritin, B12, folate, and celiac lab.   Currently on iron tablets  Follow up ***  Brandi Amy, PA-C    BP check ***

## 2023-11-28 ENCOUNTER — Ambulatory Visit: Payer: Medicaid Other | Admitting: Physician Assistant

## 2023-12-01 ENCOUNTER — Telehealth: Payer: Medicaid Other | Admitting: Family

## 2023-12-01 DIAGNOSIS — B3731 Acute candidiasis of vulva and vagina: Secondary | ICD-10-CM

## 2023-12-01 MED ORDER — FLUCONAZOLE 150 MG PO TABS: 150.0000 mg | ORAL_TABLET | ORAL | 0 refills | Status: DC | PRN

## 2023-12-01 NOTE — Progress Notes (Signed)

## 2023-12-07 DIAGNOSIS — Z419 Encounter for procedure for purposes other than remedying health state, unspecified: Secondary | ICD-10-CM | POA: Diagnosis not present

## 2023-12-16 ENCOUNTER — Encounter: Payer: Self-pay | Admitting: Family

## 2023-12-16 ENCOUNTER — Ambulatory Visit (INDEPENDENT_AMBULATORY_CARE_PROVIDER_SITE_OTHER): Payer: Medicaid Other | Admitting: Family

## 2023-12-16 VITALS — BP 113/69 | HR 93 | Temp 97.2°F | Wt 137.0 lb

## 2023-12-16 DIAGNOSIS — K30 Functional dyspepsia: Secondary | ICD-10-CM | POA: Diagnosis not present

## 2023-12-16 DIAGNOSIS — R110 Nausea: Secondary | ICD-10-CM

## 2023-12-16 DIAGNOSIS — K5909 Other constipation: Secondary | ICD-10-CM | POA: Diagnosis not present

## 2023-12-16 LAB — POCT URINE PREGNANCY: Preg Test, Ur: NEGATIVE

## 2023-12-16 MED ORDER — OMEPRAZOLE 20 MG PO CPDR
20.0000 mg | DELAYED_RELEASE_CAPSULE | Freq: Every day | ORAL | 1 refills | Status: DC
Start: 2023-12-16 — End: 2024-03-04

## 2023-12-16 NOTE — Assessment & Plan Note (Signed)
 Chronic, intermittent, severe epigastric pain radiating to the back. No relation pt thinks to food intake. No associated reflux symptoms. Previous ER visit years ago with negative ultrasound and CT scan. Possible acid-related etiology. -Start Prilosec twice daily for one week, then once daily for two weeks, then qod or prn. -Avoid acid-triggering foods (reviewed, and provided handout), reduce stress and any caffeine. -Do not lie down immediately after eating. -F/U after 2w if no change in sx.

## 2023-12-16 NOTE — Progress Notes (Signed)
 Patient ID: Brandi Church, female    DOB: 12-06-2000, 23 y.o.   MRN: [id number]  Chief Complaint  Patient presents with   Abdominal Pain    Pt c/o sternum pain and back pain, present for a year but more frequent. Pain is achy, has not tried anything for sx.    Nausea    Pt c/o nausea, denies vomiting. SX present for 2 weeks.        Discussed the use of AI scribe software for clinical note transcription with the patient, who gave verbal consent to proceed.  History of Present Illness   Brandi Church is a 23 year old female who presents with stomach pain and nausea. She has been experiencing stomach pain and nausea for the past two weeks. The pain is located in the middle of her upper abdomen and radiates to her back. It is not consistently related to eating and can occur spontaneously, sometimes requiring her to pull over while driving due to its severity. No burning sensation or reflux symptoms are present. The pain has been a recurrent issue for years but has become more frequent recently. A severe episode in the past led to a visit to the ER, where an ultrasound and CT scan were performed, but no specific treatment was provided at that time. She has not tried any OTC meds for sx. Nausea has been frequent, particularly in the mornings, over the past two weeks. It is not directly associated with the stomach pain episodes. No vomiting is reported, but she feels nauseous frequently. She is a few days late with her menstrual cycle. She has not made any recent dietary changes that could account for her symptoms, such as increased intake of acidic foods or beverages or any increase in stress/anxiety.  She has a history of constipation, with bowel movements occurring only every other week. She is on an iron  pill daily for anemia.   Assessment & Plan:     Epigastric Pain - Chronic, intermittent, severe epigastric pain radiating to the back. No relation pt thinks to food intake. No associated reflux  symptoms. Previous ER visit years ago with negative ultrasound and CT scan. Possible acid-related etiology. -Start Prilosec twice daily for one week, then once daily for two weeks, then qod or prn. -Avoid acid-triggering foods (reviewed, and provided handout), reduce stress and any caffeine. -Do not lie down immediately after eating. -F/U after 2w if no change in sx.  Nausea - Recent onset, unrelated to epigastric pain. No vomiting. Possible pregnancy ruled out with urine pregnancy test. -Continue monitoring symptoms. -Take Prilosec as instructed to rule out increased stomach acid etiology. -F/U if sx are not improving.  Chronic Constipation - Infrequent bowel movements (every other week). No associated abdominal pain, but having nausea, possibly also r/t indigestion. Daily iron  may also be contributing to constipation.  -Increase water  intake to two liters daily. -Consider over-the-counter stool softener and fiber supplements qd to ensure 1 soft BM daily, or qod. -Call office if still no BM in more than 3 days.  Subjective:    Outpatient Medications Prior to Visit  Medication Sig Dispense Refill   etonogestrel-ethinyl estradiol (NUVARING) 0.12-0.015 MG/24HR vaginal ring Place 1 each vaginally every 28 (twenty-eight) days. Insert vaginally and leave in place for 3 consecutive weeks, then remove for 1 week.     ferrous sulfate  325 (65 FE) MG EC tablet Take 1 tablet (325 mg total) by mouth daily with breakfast. 90 tablet 1   fluticasone  (FLOVENT  HFA)  110 MCG/ACT inhaler Inhale 2 puffs into the lungs daily as needed (asthma).     hydrOXYzine  (ATARAX ) 10 MG tablet Take by mouth.     ibuprofen  (ADVIL ) 800 MG tablet Take 1 tablet (800 mg total) by mouth every 8 (eight) hours as needed for moderate pain, fever or headache (Sore throat. Take after eating.). Ok to break tab in half if hard to swallow. 30 tablet 0   VENTOLIN  HFA 108 (90 Base) MCG/ACT inhaler Inhale 2 puffs into the lungs every 4  (four) hours as needed for shortness of breath.     amoxicillin -clavulanate (AUGMENTIN ) 125-31.25 MG/5ML suspension  (Patient not taking: Reported on 12/16/2023)     escitalopram  (LEXAPRO ) 10 MG tablet Take 1 tablet (10 mg total) by mouth daily. (Patient not taking: Reported on 12/16/2023) 30 tablet 1   fluconazole  (DIFLUCAN ) 150 MG tablet Take 1 tablet (150 mg total) by mouth every three (3) days as needed. (Patient not taking: Reported on 12/16/2023) 2 tablet 0   lisdexamfetamine (VYVANSE ) 40 MG capsule  (Patient not taking: Reported on 12/16/2023)     LORazepam  (ATIVAN ) 1 MG tablet Take 1 tablet (1 mg total) by mouth daily as needed for anxiety or sedation. (Patient not taking: Reported on 12/16/2023) 10 tablet 0   norethindrone (AYGESTIN) 5 MG tablet Take 5 mg by mouth 3 (three) times daily. (Patient not taking: Reported on 12/16/2023)     ondansetron  (ZOFRAN ) 4 MG tablet Take 1 tablet (4 mg total) by mouth every 6 (six) hours. (Patient not taking: Reported on 12/16/2023) 12 tablet 0   oxyCODONE  (ROXICODONE ) 5 MG immediate release tablet Take 1-2 tablets (5-10 mg total) by mouth every 4 (four) hours as needed for severe pain (pain score 7-10). (Patient not taking: Reported on 12/16/2023) 15 tablet 0   traZODone  (DESYREL ) 50 MG tablet TAKE 1/2 TO 1 TABLET (25 TO 50 MG TOTAL) BY MOUTH AT BEDTIME (Patient not taking: Reported on 12/16/2023)     No facility-administered medications prior to visit.   Past Medical History:  Diagnosis Date   ADHD (attention deficit hyperactivity disorder)    Anemia    Asthma    Complication of anesthesia    Depression    GAD (generalized anxiety disorder)    Headache disorder 06/22/2020   History of RSV infection 10/20/2022   ED visit in epic positive test and dx viral tonsillitis   History of tonsillitis 11/24/2022   (12-25-2022 per pt symptoms completely resolved)  ED visit in epic, can't breath or swallow due to enlarged tonsill's , dx acute pharyngitis;   in epic  per  PCP note 11-26-2022  dx tonsillitis w/ exudate , given prednisone  pack   MDD (major depressive disorder)    12/28/22   MDD (major depressive disorder), recurrent episode, moderate (HCC) 01/19/2021   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/26/2023   PONV (postoperative nausea and vomiting)    Supervision of normal first pregnancy 08/16/2021              Nursing Staff    Provider      Office Location     [facility]    Dating     LMP      Language      ENGLISH    Anatomy US      [facility], resolved on follow-up      Flu Vaccine     08/14/2021    Genetic/Carrier Screen     NIPS: low risk  AFP:   Screen neg  Horizon: negative      TDaP Vaccine      12/20/2021    Hgb A1C or   GTT    Early   Third trimester [id number]      COVID Vaccine     YES         L   Unintended weight loss 10/08/2022   Past Surgical History:  Procedure Laterality Date   LABIOPLASTY N/A 12/31/2022   Procedure: LABIAPLASTY;  Surgeon: Renea Carrion, MD;  Location: [facility] OR;  Service: Gynecology;  Laterality: N/A;   NO PAST SURGERIES     TONSILLECTOMY     WISDOM TOOTH EXTRACTION     removed on 08/16/2022.  11/28/2021   Allergies  Allergen Reactions   Latex Itching and Swelling    Reports swelling of mouth and itching      Objective:    Physical Exam Vitals and nursing note reviewed.  Constitutional:      Appearance: Normal appearance.  Cardiovascular:     Rate and Rhythm: Normal rate and regular rhythm.  Pulmonary:     Effort: Pulmonary effort is normal.     Breath sounds: Normal breath sounds.  Musculoskeletal:        General: Normal range of motion.  Skin:    General: Skin is warm and dry.  Neurological:     Mental Status: She is alert.  Psychiatric:        Mood and Affect: Mood normal.        Behavior: Behavior normal.    BP 113/69 (BP Location: Left Arm, Patient Position: Sitting, Cuff Size: Small)   Pulse 93   Temp (!) 97.2 F (36.2 C) (Temporal)   Wt 137 lb (62.1 kg)   LMP 11/07/2023 (Exact Date)    SpO2 100%   Breastfeeding No   BMI 23.52 kg/m  Wt Readings from Last 3 Encounters:  12/16/23 137 lb (62.1 kg)  11/02/23 128 lb (58.1 kg)  10/02/23 134 lb (60.8 kg)       Versa Gore, NP

## 2023-12-16 NOTE — Assessment & Plan Note (Signed)
 Infrequent bowel movements (every other week). No associated abdominal pain, but having nausea, possibly also r/t GERD. -Increase water  intake to two liters daily. -Consider over-the-counter stool softener and fiber supplements qd to ensure 1 soft BM daily, or qod. -Call office if still no BM in more than 3 days.

## 2023-12-28 DIAGNOSIS — Z20822 Contact with and (suspected) exposure to covid-19: Secondary | ICD-10-CM | POA: Diagnosis not present

## 2023-12-28 DIAGNOSIS — R0981 Nasal congestion: Secondary | ICD-10-CM | POA: Diagnosis not present

## 2023-12-28 DIAGNOSIS — J069 Acute upper respiratory infection, unspecified: Secondary | ICD-10-CM | POA: Diagnosis not present

## 2024-01-01 ENCOUNTER — Encounter (HOSPITAL_BASED_OUTPATIENT_CLINIC_OR_DEPARTMENT_OTHER): Payer: Self-pay

## 2024-01-01 ENCOUNTER — Other Ambulatory Visit: Payer: Self-pay

## 2024-01-01 ENCOUNTER — Emergency Department (HOSPITAL_BASED_OUTPATIENT_CLINIC_OR_DEPARTMENT_OTHER)
Admission: EM | Admit: 2024-01-01 | Discharge: 2024-01-02 | Disposition: A | Discharge status: Home / Self Care | Payer: Medicaid Other | Attending: Emergency Medicine | Admitting: Emergency Medicine

## 2024-01-01 DIAGNOSIS — Z91040 Latex allergy status: Secondary | ICD-10-CM | POA: Insufficient documentation

## 2024-01-01 DIAGNOSIS — Z20822 Contact with and (suspected) exposure to covid-19: Secondary | ICD-10-CM | POA: Diagnosis not present

## 2024-01-01 DIAGNOSIS — R519 Headache, unspecified: Secondary | ICD-10-CM | POA: Insufficient documentation

## 2024-01-01 NOTE — ED Triage Notes (Signed)
 Headache x 5 days, Says she's been to UC earlier and received Toradol injection with no improvement. UC encouraged her to ED for further evaluation.

## 2024-01-02 ENCOUNTER — Emergency Department (HOSPITAL_BASED_OUTPATIENT_CLINIC_OR_DEPARTMENT_OTHER): Payer: Medicaid Other

## 2024-01-02 DIAGNOSIS — R519 Headache, unspecified: Secondary | ICD-10-CM | POA: Diagnosis not present

## 2024-01-02 MED ORDER — METOCLOPRAMIDE HCL 5 MG/ML IJ SOLN
10.0000 mg | Freq: Once | INTRAMUSCULAR | Status: AC
  Administered 2024-01-02: 10 mg via INTRAVENOUS
  Filled 2024-01-02: qty 2

## 2024-01-02 MED ORDER — DIPHENHYDRAMINE HCL 50 MG/ML IJ SOLN
25.0000 mg | Freq: Once | INTRAMUSCULAR | Status: AC
  Administered 2024-01-02: 25 mg via INTRAVENOUS
  Filled 2024-01-02: qty 1

## 2024-01-02 MED ORDER — DEXAMETHASONE SODIUM PHOSPHATE 10 MG/ML IJ SOLN
10.0000 mg | Freq: Once | INTRAMUSCULAR | Status: AC
  Administered 2024-01-02: 10 mg via INTRAVENOUS
  Filled 2024-01-02: qty 1

## 2024-01-02 NOTE — ED Provider Notes (Signed)
 [facility] AT [facility] Provider Note   CSN: [id number] Arrival date & time: 01/01/24  1943     History  Chief Complaint  Patient presents with   Headache    Brandi Church is a 23 y.o. female.  Headache x 4-5 days intermittent, wrapping around head. Similar to previous but never this long or bad. No neurologic symptoms. No fevers. No trauma.     Headache      Home Medications Prior to Admission medications   Medication Sig Start Date End Date Taking? Authorizing Provider  etonogestrel-ethinyl estradiol (NUVARING) 0.12-0.015 MG/24HR vaginal ring Place 1 each vaginally every 28 (twenty-eight) days. Insert vaginally and leave in place for 3 consecutive weeks, then remove for 1 week.    [provider]  ferrous sulfate 325 (65 FE) MG EC tablet Take 1 tablet (325 mg total) by mouth daily with breakfast. 08/08/23   Dulce Sellar, NP  fluticasone (FLOVENT HFA) 110 MCG/ACT inhaler Inhale 2 puffs into the lungs daily as needed (asthma).    [provider]  hydrOXYzine (ATARAX) 10 MG tablet Take by mouth. 04/11/21   [provider]  ibuprofen (ADVIL) 800 MG tablet Take 1 tablet (800 mg total) by mouth every 8 (eight) hours as needed for moderate pain, fever or headache (Sore throat. Take after eating.). Ok to break tab in half if hard to swallow. 08/13/23   Dulce Sellar, NP  omeprazole (PRILOSEC) 20 MG capsule Take 1 capsule (20 mg total) by mouth daily. Take twice a day for 1 week, then daily for 2 weeks, then every other day or as needed. 12/16/23   Dulce Sellar, NP  ondansetron (ZOFRAN) 4 MG tablet Take 1 tablet (4 mg total) by mouth every 6 (six) hours. Patient not taking: Reported on 12/16/2023 08/12/23   Horton, Clabe Seal, DO  VENTOLIN HFA 108 (90 Base) MCG/ACT inhaler Inhale 2 puffs into the lungs every 4 (four) hours as needed for shortness of breath. 06/23/22   [provider]      Allergies    Latex     Review of Systems   Review of Systems  Neurological:  Positive for headaches.    Physical Exam Updated Vital Signs BP 113/75 (BP Location: Right Arm)   Pulse 86   Temp 98.2 F (36.8 C) (Oral)   Resp 16   Ht 5\' 4"  (1.626 m)   Wt 60.3 kg   LMP 12/18/2023 (Exact Date)   SpO2 100%   BMI 22.83 kg/m  Physical Exam Vitals and nursing note reviewed.  Constitutional:      Appearance: She is well-developed.  HENT:     Head: Normocephalic and atraumatic.  Cardiovascular:     Rate and Rhythm: Normal rate and regular rhythm.  Pulmonary:     Effort: No respiratory distress.     Breath sounds: No stridor.  Abdominal:     General: There is no distension.  Musculoskeletal:     Cervical back: Normal range of motion.  Neurological:     Mental Status: She is alert and oriented to person, place, and time. Mental status is at baseline.     Comments: No altered mental status, able to give full seemingly accurate history.  Face is symmetric, EOM's intact, pupils equal and reactive, vision intact, tongue and uvula midline without deviation. Upper and Lower extremity motor 5/5, intact pain perception in distal extremities, 2+ reflexes in biceps, patella and achilles tendons. Able to perform finger to nose normal with  both hands. Walks without assistance or evident ataxia.       ED Results / Procedures / Treatments   Labs (all labs ordered are listed, but only abnormal results are displayed) Labs Reviewed - No data to display  EKG None  Radiology CT Head Wo Contrast Result Date: 01/02/2024 CLINICAL DATA:  Headache, increasing frequency or severity EXAM: CT HEAD WITHOUT CONTRAST TECHNIQUE: Contiguous axial images were obtained from the base of the skull through the vertex without intravenous contrast. RADIATION DOSE REDUCTION: This exam was performed according to the departmental dose-optimization program which includes automated exposure control, adjustment of the mA and/or kV according  to patient size and/or use of iterative reconstruction technique. COMPARISON:  05/05/2021 FINDINGS: Brain: No acute intracranial abnormality. Specifically, no hemorrhage, hydrocephalus, mass lesion, acute infarction, or significant intracranial injury. Vascular: No hyperdense vessel or unexpected calcification. Skull: No acute calvarial abnormality. Sinuses/Orbits: No acute findings Other: None IMPRESSION: Normal study. Electronically Signed   By: Charlett Nose M.D.   On: 01/02/2024 01:05    Procedures Procedures    Medications Ordered in ED Medications  metoCLOPramide (REGLAN) injection 10 mg (10 mg Intravenous Given 01/02/24 0046)  diphenhydrAMINE (BENADRYL) injection 25 mg (25 mg Intravenous Given 01/02/24 0042)  dexamethasone (DECADRON) injection 10 mg (10 mg Intravenous Given 01/02/24 0038)    ED Course/ Medical Decision Making/ A&P                                 Medical Decision Making Amount and/or Complexity of Data Reviewed Radiology: ordered.  Risk Prescription drug management.   Ct done secondary to worsening and length of symptoms along with OCP's. Negative on my interpretation. No red flags to suggest meningitis or other emergent causes. HA improved significantly with HA cocktail here. Stable for d/c with mother, suggested neuro follow up.   Final Clinical Impression(s) / ED Diagnoses Final diagnoses:  Nonintractable headache, unspecified chronicity pattern, unspecified headache type    Rx / DC Orders ED Discharge Orders          Ordered    Ambulatory referral to Neurology       Comments: An appointment is requested in approximately: 4 weeks   01/02/24 0146              Nathen Balaban, Barbara Cower, MD 01/02/24 [phone]

## 2024-01-04 DIAGNOSIS — Z419 Encounter for procedure for purposes other than remedying health state, unspecified: Secondary | ICD-10-CM | POA: Diagnosis not present

## 2024-01-09 DIAGNOSIS — Z111 Encounter for screening for respiratory tuberculosis: Secondary | ICD-10-CM | POA: Diagnosis not present

## 2024-01-12 DIAGNOSIS — N760 Acute vaginitis: Secondary | ICD-10-CM | POA: Diagnosis not present

## 2024-01-12 DIAGNOSIS — N898 Other specified noninflammatory disorders of vagina: Secondary | ICD-10-CM | POA: Diagnosis not present

## 2024-02-02 ENCOUNTER — Telehealth: Admitting: Nurse Practitioner

## 2024-02-02 ENCOUNTER — Other Ambulatory Visit: Payer: Self-pay | Admitting: Family

## 2024-02-02 DIAGNOSIS — B9789 Other viral agents as the cause of diseases classified elsewhere: Secondary | ICD-10-CM | POA: Diagnosis not present

## 2024-02-02 DIAGNOSIS — J019 Acute sinusitis, unspecified: Secondary | ICD-10-CM

## 2024-02-02 MED ORDER — IBUPROFEN 800 MG PO TABS: 800.0000 mg | ORAL_TABLET | Freq: Four times a day (QID) | ORAL | 0 refills | Status: DC | PRN

## 2024-02-02 MED ORDER — IPRATROPIUM BROMIDE 0.03 % NA SOLN: 2.0000 | Freq: Two times a day (BID) | NASAL | 0 refills | Status: DC

## 2024-02-02 NOTE — Progress Notes (Signed)
 Providers prescribe antibiotics to treat infections caused by bacteria. Antibiotics are very powerful in treating bacterial infections when they are used properly. To maintain their effectiveness, they should be used only when necessary. Overuse of antibiotics has resulted in the development of superbugs that are resistant to treatment!    After careful review of your answers, I would not recommend an antibiotic for your condition.  Antibiotics are not effective against viruses and therefore should not be used to treat them. Common examples of infections caused by viruses include colds and flu  Please feel free to reach back out to [place] in 5-7 days if you are still experiencing symptoms. Viral sinusitis can cause the same symptom as bacterial sinusitis but will usually resolve on its on in about a week or so.   E-Visit for Sinus Problems  We are sorry that you are not feeling well.  Here is how we plan to help!  Based on what you have shared with me it looks like you have sinusitis.  Sinusitis is inflammation and infection in the sinus cavities of the head.  Based on your presentation I believe you most likely have Acute Viral Sinusitis.This is an infection most likely caused by a virus. There is not specific treatment for viral sinusitis other than to help you with the symptoms until the infection runs its course.  You may use an oral decongestant such as Mucinex D or if you have glaucoma or high blood pressure use plain Mucinex. Saline nasal spray help and can safely be used as often as needed for congestion, I have prescribed: Ipratropium Bromide nasal spray 0.03% 2 sprays in eah nostril 2-3 times a day and ibuprofen for headache.   Some authorities believe that zinc sprays or the use of Echinacea may shorten the course of your symptoms.  Sinus infections are not as easily transmitted as other respiratory infection, however we still recommend that you avoid close contact with loved ones, especially the  very young and elderly.  Remember to wash your hands thoroughly throughout the day as this is the number one way to prevent the spread of infection!  Home Care: Only take medications as instructed by your medical team. Do not take these medications with alcohol. A steam or ultrasonic humidifier can help congestion.  You can place a towel over your head and breathe in the steam from hot water coming from a faucet. Avoid close contacts especially the very young and the elderly. Cover your mouth when you cough or sneeze. Always remember to wash your hands.  Get Help Right Away If: You develop worsening fever or sinus pain. You develop a severe head ache or visual changes. Your symptoms persist after you have completed your treatment plan.  Make sure you Understand these instructions. Will watch your condition. Will get help right away if you are not doing well or get worse.   Thank you for choosing an e-visit.  Your e-visit answers were reviewed by a board certified advanced clinical practitioner to complete your personal care plan. Depending upon the condition, your plan could have included both over the counter or prescription medications.  Please review your pharmacy choice. Make sure the pharmacy is open so you can pick up prescription now. If there is a problem, you may contact your provider through [employer] and have the prescription routed to another pharmacy.  Your safety is important to [place]. If you have drug allergies check your prescription carefully.   For the next 24 hours  you can use MyChart to ask questions about today's visit, request a non-urgent call back, or ask for a work or school excuse. You will get an email in the next two days asking about your experience. I hope that your e-visit has been valuable and will speed your recovery.

## 2024-02-02 NOTE — Progress Notes (Signed)
 I have spent 5 minutes in review of e-visit questionnaire, review and updating patient chart, medical decision making and response to patient.   Claiborne Rigg, NP

## 2024-02-15 DIAGNOSIS — Z419 Encounter for procedure for purposes other than remedying health state, unspecified: Secondary | ICD-10-CM | POA: Diagnosis not present

## 2024-02-26 ENCOUNTER — Ambulatory Visit
Admission: EM | Admit: 2024-02-26 | Discharge: 2024-02-26 | Disposition: A | Discharge status: Home / Self Care | Attending: Family Medicine | Admitting: Family Medicine

## 2024-02-26 DIAGNOSIS — H6122 Impacted cerumen, left ear: Secondary | ICD-10-CM | POA: Diagnosis not present

## 2024-02-26 DIAGNOSIS — H6992 Unspecified Eustachian tube disorder, left ear: Secondary | ICD-10-CM | POA: Diagnosis not present

## 2024-02-26 MED ORDER — FLUTICASONE PROPIONATE 50 MCG/ACT NA SUSP
1.0000 | Freq: Every day | NASAL | 0 refills | Status: DC
Start: 2024-02-26 — End: 2024-06-17

## 2024-02-26 NOTE — Discharge Instructions (Signed)
 Start Flonase  daily.  Start over-the-counter Claritin  or Zyrtec-D to help dry up the fluid behind your ears.  Follow-up with your PCP if your symptoms do not improve.  Please go to the ER for any worsening symptoms.  Hope you feel better soon!

## 2024-02-26 NOTE — ED Triage Notes (Signed)
 Pt present with c/o lt ear fullness x 4 days. Pt denies fever and other symptoms.

## 2024-02-26 NOTE — ED Provider Notes (Signed)
 [facility]    CSN: [id number] Arrival date & time: 02/26/24  1609      History   Chief Complaint Chief Complaint  Patient presents with   Ear Pain    HPI Brandi Church is a 23 y.o. female presents for ear pain.  Patient reports 4 days of left ear fullness with some discomfort.  No drainage, fevers or chills.  No URI symptoms.  Does endorse some allergy symptoms for which she is taking over-the-counter Zyrtec.  No other concerns at this time.  HPI  Past Medical History:  Diagnosis Date   ADHD (attention deficit hyperactivity disorder)    Anemia    Asthma    Complication of anesthesia    Depression    GAD (generalized anxiety disorder)    Headache disorder 06/22/2020   History of RSV infection 10/20/2022   ED visit in epic positive test and dx viral tonsillitis   History of tonsillitis 11/24/2022   (12-25-2022 per pt symptoms completely resolved)  ED visit in epic, can't breath or swallow due to enlarged tonsill's , dx acute pharyngitis;   in epic per  PCP note 11-26-2022  dx tonsillitis w/ exudate , given prednisone  pack   MDD (major depressive disorder)    12/28/22   MDD (major depressive disorder), recurrent episode, moderate (HCC) 01/19/2021   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/26/2023   PONV (postoperative nausea and vomiting)    Supervision of normal first pregnancy 08/16/2021              Nursing Staff    Provider      Office Location     [facility]    Dating     LMP      Language      ENGLISH    Anatomy US      CPC, resolved on follow-up      Flu Vaccine     08/14/2021    Genetic/Carrier Screen     NIPS: low risk  AFP:   Screen neg  Horizon: negative      TDaP Vaccine      12/20/2021    Hgb A1C or   GTT    Early   Third trimester 74-92-84      COVID Vaccine     YES         L   Unintended weight loss 10/08/2022    Patient Active Problem List   Diagnosis Date Noted   Other constipation 12/16/2023   Acid indigestion 12/16/2023   Iron  deficiency  anemia 07/31/2023   Attention deficit hyperactivity disorder (ADHD), predominantly inattentive type 01/19/2021   Generalized anxiety disorder with panic attacks 12/09/2020   Asthma 05/26/2019    Past Surgical History:  Procedure Laterality Date   LABIOPLASTY N/A 12/31/2022   Procedure: LABIAPLASTY;  Surgeon: Renea Carrion, MD;  Location: [facility] OR;  Service: Gynecology;  Laterality: N/A;   NO PAST SURGERIES     TONSILLECTOMY     WISDOM TOOTH EXTRACTION     removed on 08/16/2022.  11/28/2021    OB History     Gravida  1   Para  1   Term  1   Preterm  0   AB  0   Living  1      SAB  0   IAB  0   Ectopic  0   Multiple  0   Live Births  1            Home Medications  Prior to Admission medications   Medication Sig Start Date End Date Taking? Authorizing Provider  fluticasone  (FLONASE ) 50 MCG/ACT nasal spray Place 1 spray into both nostrils daily. 02/26/24  Yes Hero Mccathern, Jodi R, NP  etonogestrel-ethinyl estradiol (NUVARING) 0.12-0.015 MG/24HR vaginal ring Place 1 each vaginally every 28 (twenty-eight) days. Insert vaginally and leave in place for 3 consecutive weeks, then remove for 1 week.    [provider]  ferrous sulfate  325 (65 FE) MG EC tablet Take 1 tablet (325 mg total) by mouth daily with breakfast. 08/08/23   Versa Gore, NP  fluticasone  (FLOVENT  HFA) 110 MCG/ACT inhaler Inhale 2 puffs into the lungs daily as needed (asthma).    [provider]  hydrOXYzine  (ATARAX ) 10 MG tablet Take by mouth. 04/11/21   [provider]  ibuprofen  (ADVIL ) 800 MG tablet Take 1 tablet (800 mg total) by mouth every 6 (six) hours as needed for headache or moderate pain (pain score 4-6). 02/02/24   Fleming, Zelda W, NP  ipratropium (ATROVENT ) 0.03 % nasal spray Place 2 sprays into both nostrils every 12 (twelve) hours. 02/02/24   Fleming, Zelda W, NP  omeprazole  (PRILOSEC) 20 MG capsule Take 1 capsule (20 mg total) by mouth daily. Take twice a day  for 1 week, then daily for 2 weeks, then every other day or as needed. 12/16/23   Versa Gore, NP  ondansetron  (ZOFRAN ) 4 MG tablet Take 1 tablet (4 mg total) by mouth every 6 (six) hours. Patient not taking: Reported on 12/16/2023 08/12/23   Horton, Sidra Dredge, DO  VENTOLIN  HFA 108 (90 Base) MCG/ACT inhaler Inhale 2 puffs into the lungs every 4 (four) hours as needed for shortness of breath. 06/23/22   [provider]    Family History Family History  Problem Relation Age of Onset   Depression Mother    Asthma Mother    Depression Father    Asthma Father    Hyperlipidemia Maternal Grandmother    Heart disease Maternal Grandmother    Hypertension Maternal Grandmother    Hyperlipidemia Maternal Grandfather    Heart disease Maternal Grandfather    COPD Maternal Grandfather    Hypertension Maternal Grandfather    Diabetes Maternal Grandfather     Social History Social History   Tobacco Use   Smoking status: Never    Passive exposure: Never   Smokeless tobacco: Never  Vaping Use   Vaping status: Never Used  Substance Use Topics   Alcohol use: Never   Drug use: Never     Allergies   Latex   Review of Systems Review of Systems  HENT:  Positive for ear pain.      Physical Exam Triage Vital Signs ED Triage Vitals [02/26/24 1614]  Encounter Vitals Group     BP 103/67     Systolic BP Percentile      Diastolic BP Percentile      Pulse Rate 88     Resp 17     Temp 98.8 F (37.1 C)     Temp Source Oral     SpO2 98 %     Weight      Height      Head Circumference      Peak Flow      Pain Score 4     Pain Loc      Pain Education      Exclude from Growth Chart    No data found.  Updated Vital Signs BP 103/67  Pulse 88   Temp 98.8 F (37.1 C) (Oral)   Resp 17   LMP 02/04/2024 (Exact Date)   SpO2 98%   Breastfeeding No   Visual Acuity Right Eye Distance:   Left Eye Distance:   Bilateral Distance:    Right Eye Near:   Left Eye Near:     Bilateral Near:     Physical Exam Vitals and nursing note reviewed.  Constitutional:      General: She is not in acute distress.    Appearance: Normal appearance. She is not ill-appearing.  HENT:     Head: Normocephalic and atraumatic.     Right Ear: Tympanic membrane and ear canal normal.     Left Ear: A middle ear effusion is present. There is impacted cerumen. Tympanic membrane is not erythematous.     Ears:     Comments: After irrigation TM visible without erythema but does show small effusion. Eyes:     Pupils: Pupils are equal, round, and reactive to light.  Cardiovascular:     Rate and Rhythm: Normal rate.  Pulmonary:     Effort: Pulmonary effort is normal.  Skin:    General: Skin is warm and dry.  Neurological:     General: No focal deficit present.     Mental Status: She is alert and oriented to person, place, and time.  Psychiatric:        Mood and Affect: Mood normal.        Behavior: Behavior normal.      UC Treatments / Results  Labs (all labs ordered are listed, but only abnormal results are displayed) Labs Reviewed - No data to display  EKG   Radiology No results found.  Procedures Procedures (including critical care time)  Medications Ordered in UC Medications - No data to display  Initial Impression / Assessment and Plan / UC Course  I have reviewed the triage vital signs and the nursing notes.  Pertinent labs & imaging results that were available during my care of the patient were reviewed by me and considered in my medical decision making (see chart for details).     Reviewed exam and symptoms with patient.  No red flags.  Will start Flonase  and recommended OTC Claritin -D or Zyrtec-D.  Advised PCP follow-up if symptoms do not improve.  ER precautions reviewed and patient verbalized understanding. Final Clinical Impressions(s) / UC Diagnoses   Final diagnoses:  Impacted cerumen of left ear  Eustachian tube dysfunction, left      Discharge Instructions      Start Flonase  daily.  Start over-the-counter Claritin  or Zyrtec-D to help dry up the fluid behind your ears.  Follow-up with your PCP if your symptoms do not improve.  Please go to the ER for any worsening symptoms.  Hope you feel better soon!     ED Prescriptions     Medication Sig Dispense Auth. Provider   fluticasone  (FLONASE ) 50 MCG/ACT nasal spray Place 1 spray into both nostrils daily. 15.8 mL Devontre Siedschlag, Jodi R, NP      PDMP not reviewed this encounter.   Alleen Arbour, NP 02/26/24 [phone]

## 2024-02-27 ENCOUNTER — Other Ambulatory Visit: Payer: Self-pay | Admitting: Nurse Practitioner

## 2024-02-27 DIAGNOSIS — B9789 Other viral agents as the cause of diseases classified elsewhere: Secondary | ICD-10-CM

## 2024-03-04 ENCOUNTER — Ambulatory Visit (INDEPENDENT_AMBULATORY_CARE_PROVIDER_SITE_OTHER): Admitting: Family

## 2024-03-04 VITALS — BP 100/67 | HR 80 | Temp 98.2°F | Ht 64.0 in | Wt 138.2 lb

## 2024-03-04 DIAGNOSIS — R21 Rash and other nonspecific skin eruption: Secondary | ICD-10-CM

## 2024-03-04 MED ORDER — TRIAMCINOLONE ACETONIDE 0.1 % EX CREA: 1.0000 | TOPICAL_CREAM | Freq: Two times a day (BID) | CUTANEOUS | 0 refills | Status: DC

## 2024-03-04 NOTE — Progress Notes (Signed)
 Patient ID: Brandi Church, female    DOB: Jul 24, 2001, 23 y.o.   MRN: [id number]  Chief Complaint  Patient presents with   Rash    Pt c/o rash on face, has tried benadryl  and Claritin  which did not help. Present for 3 days.   Discussed the use of AI scribe software for clinical note transcription with the patient, who gave verbal consent to proceed.  History of Present Illness Brandi Church is a 23 year old female who presents with a facial rash.  The rash began on Sunday, is itchy, and has slightly worsened but appears better today. It is primarily on her face and has spread slightly to her cheek. She has been using Benadryl  cream and oral Benadryl  for symptom relief. She was outside more than usual on Sunday, possibly exposed to increased pollen levels. Similar rashes have occurred during past allergy seasons but typically resolve more quickly. She is taking Claritin  D for allergy symptoms and has been using it for three days. She also uses Flonase  nasal spray as part of her allergy management.  Assessment & Plan Allergic contact dermatitis Acute pinpoint raised bumps, no erythema, facial rash likely from pollen exposure. Advised caution with Claritin  D due to side effects. Recommended switching to regular Claritin  or Zyrtec and using Flonase  for better control. Discussed steroid cream use and advised mixing with moisturizer to prevent skin lightening. - Prescribe triamcinolone steroid ointment or cream. Mix equal parts with facial moisturizer. - Continue Claritin  D for up to one week, then switch to regular Claritin  or Zyrtec. - Use Flonase  nasal spray (pt has at home), one squirt each side, daily for ongoing allergy symptoms. - Advise on hand hygiene to prevent pollen exposure. - Instruct to report if symptoms do not improve.   Subjective:    Outpatient Medications Prior to Visit  Medication Sig Dispense Refill   etonogestrel-ethinyl estradiol (NUVARING) 0.12-0.015 MG/24HR vaginal ring  Place 1 each vaginally every 28 (twenty-eight) days. Insert vaginally and leave in place for 3 consecutive weeks, then remove for 1 week.     ferrous sulfate  325 (65 FE) MG EC tablet Take 1 tablet (325 mg total) by mouth daily with breakfast. 90 tablet 1   fluticasone  (FLONASE ) 50 MCG/ACT nasal spray Place 1 spray into both nostrils daily. 15.8 mL 0   fluticasone  (FLOVENT  HFA) 110 MCG/ACT inhaler Inhale 2 puffs into the lungs daily as needed (asthma).     hydrOXYzine  (ATARAX ) 10 MG tablet Take by mouth.     ibuprofen  (ADVIL ) 800 MG tablet Take 1 tablet (800 mg total) by mouth every 6 (six) hours as needed for headache or moderate pain (pain score 4-6). 32 tablet 0   VENTOLIN  HFA 108 (90 Base) MCG/ACT inhaler Inhale 2 puffs into the lungs every 4 (four) hours as needed for shortness of breath.     ipratropium (ATROVENT ) 0.03 % nasal spray Place 2 sprays into both nostrils every 12 (twelve) hours. (Patient not taking: Reported on 03/04/2024) 30 mL 0   omeprazole  (PRILOSEC) 20 MG capsule Take 1 capsule (20 mg total) by mouth daily. Take twice a day for 1 week, then daily for 2 weeks, then every other day or as needed. (Patient not taking: Reported on 03/04/2024) 45 capsule 1   ondansetron  (ZOFRAN ) 4 MG tablet Take 1 tablet (4 mg total) by mouth every 6 (six) hours. (Patient not taking: Reported on 03/04/2024) 12 tablet 0   No facility-administered medications prior to visit.   Past Medical History:  Diagnosis Date   ADHD (attention deficit hyperactivity disorder)    Anemia    Asthma    Complication of anesthesia    Depression    GAD (generalized anxiety disorder)    Headache disorder 06/22/2020   History of RSV infection 10/20/2022   ED visit in epic positive test and dx viral tonsillitis   History of tonsillitis 11/24/2022   (12-25-2022 per pt symptoms completely resolved)  ED visit in epic, can't breath or swallow due to enlarged tonsill's , dx acute pharyngitis;   in epic per  PCP note  11-26-2022  dx tonsillitis w/ exudate , given prednisone  pack   MDD (major depressive disorder)    12/28/22   MDD (major depressive disorder), recurrent episode, moderate (HCC) 01/19/2021   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/26/2023   PONV (postoperative nausea and vomiting)    Supervision of normal first pregnancy 08/16/2021              Nursing Staff    Provider      Office Location     [facility]    Dating     LMP      Language      ENGLISH    Anatomy US      CPC, resolved on follow-up      Flu Vaccine     08/14/2021    Genetic/Carrier Screen     NIPS: low risk  AFP:   Screen neg  Horizon: negative      TDaP Vaccine      12/20/2021    Hgb A1C or   GTT    Early   Third trimester 74-92-84      COVID Vaccine     YES         L   Unintended weight loss 10/08/2022   Past Surgical History:  Procedure Laterality Date   LABIOPLASTY N/A 12/31/2022   Procedure: LABIAPLASTY;  Surgeon: Renea Carrion, MD;  Location: [facility] OR;  Service: Gynecology;  Laterality: N/A;   NO PAST SURGERIES     TONSILLECTOMY     WISDOM TOOTH EXTRACTION     removed on 08/16/2022.  11/28/2021   Allergies  Allergen Reactions   Latex Itching and Swelling    Reports swelling of mouth and itching      Objective:    Physical Exam Vitals and nursing note reviewed.  Constitutional:      Appearance: Normal appearance.  Cardiovascular:     Rate and Rhythm: Normal rate and regular rhythm.  Pulmonary:     Effort: Pulmonary effort is normal.     Breath sounds: Normal breath sounds.  Musculoskeletal:        General: Normal range of motion.  Skin:    General: Skin is warm and dry.     Findings: Rash (pinpoint, clear papules on bilateral cheeks and under left eye) present.  Neurological:     Mental Status: She is alert.  Psychiatric:        Mood and Affect: Mood normal.        Behavior: Behavior normal.    BP 100/67 (BP Location: Left Arm, Patient Position: Sitting, Cuff Size: Normal)   Pulse 80   Temp 98.2 F  (36.8 C) (Temporal)   Ht 5\' 4"  (1.626 m)   Wt 138 lb 3.2 oz (62.7 kg)   LMP 02/04/2024 (Exact Date)   SpO2 99%   Breastfeeding No   BMI 23.72 kg/m  Wt Readings from Last 3 Encounters:  03/04/24 138  lb 3.2 oz (62.7 kg)  01/01/24 133 lb (60.3 kg)  12/16/23 137 lb (62.1 kg)      Versa Gore, NP

## 2024-03-16 DIAGNOSIS — Z419 Encounter for procedure for purposes other than remedying health state, unspecified: Secondary | ICD-10-CM | POA: Diagnosis not present

## 2024-03-24 ENCOUNTER — Ambulatory Visit: Admitting: Neurology

## 2024-03-24 ENCOUNTER — Encounter: Payer: Self-pay | Admitting: Neurology

## 2024-04-03 DIAGNOSIS — Z114 Encounter for screening for human immunodeficiency virus [HIV]: Secondary | ICD-10-CM | POA: Diagnosis not present

## 2024-04-03 DIAGNOSIS — Z113 Encounter for screening for infections with a predominantly sexual mode of transmission: Secondary | ICD-10-CM | POA: Diagnosis not present

## 2024-04-13 ENCOUNTER — Telehealth

## 2024-04-13 DIAGNOSIS — B9689 Other specified bacterial agents as the cause of diseases classified elsewhere: Secondary | ICD-10-CM

## 2024-04-13 DIAGNOSIS — N760 Acute vaginitis: Secondary | ICD-10-CM

## 2024-04-14 MED ORDER — METRONIDAZOLE 0.75 % VA GEL: 1.0000 | Freq: Every day | VAGINAL | 0 refills | Status: AC

## 2024-04-14 NOTE — Progress Notes (Signed)
 I have spent 5 minutes in review of e-visit questionnaire, review and updating patient chart, medical decision making and response to patient.   Piedad Climes, PA-C

## 2024-04-14 NOTE — Progress Notes (Signed)

## 2024-04-16 DIAGNOSIS — Z419 Encounter for procedure for purposes other than remedying health state, unspecified: Secondary | ICD-10-CM | POA: Diagnosis not present

## 2024-04-23 ENCOUNTER — Ambulatory Visit
Admission: EM | Admit: 2024-04-23 | Discharge: 2024-04-23 | Disposition: A | Discharge status: Home / Self Care | Attending: Emergency Medicine | Admitting: Emergency Medicine

## 2024-04-23 DIAGNOSIS — A084 Viral intestinal infection, unspecified: Secondary | ICD-10-CM

## 2024-04-23 MED ORDER — ONDANSETRON 4 MG PO TBDP
4.0000 mg | ORAL_TABLET | Freq: Once | ORAL | Status: AC
Start: 2024-04-23 — End: 2024-04-23
  Administered 2024-04-23: 4 mg via ORAL

## 2024-04-23 MED ORDER — ONDANSETRON 4 MG PO TBDP: 4.0000 mg | ORAL_TABLET | Freq: Three times a day (TID) | ORAL | 0 refills | Status: DC | PRN

## 2024-04-23 MED ORDER — ONDANSETRON HCL 4 MG/2ML IJ SOLN: 4.0000 mg | Freq: Once | INTRAMUSCULAR | Status: DC

## 2024-04-23 NOTE — ED Triage Notes (Signed)
 Pt presents with c/o stomach pain since 0900. Pt states around 1000 she started vomiting. States she last had an episode around 10 minutes ago.   Has not taken anything for relief.

## 2024-04-23 NOTE — Discharge Instructions (Signed)
 Your symptoms are most likely caused by a virus, it will work its way out your system over the next few days ? ?You can use zofran every 8 hours as needed for nausea, be mindful this medication may make you drowsy, take the first dose at home to see how it affects your body ? ?You can use over-the-counter ibuprofen or Tylenol, which ever you have at home, to help manage fevers ? ?Continue to promote hydration throughout the day by using electrolyte replacement solution such as Gatorade, body armor, Pedialyte, which ever you have at home ? ?Try eating bland foods such as bread, rice, toast, fruit which are easier on the stomach to digest, avoid foods that are overly spicy, overly seasoned or greasy  ?

## 2024-04-23 NOTE — ED Provider Notes (Signed)
 [facility]    CSN: [id number] Arrival date & time: 04/23/24  1201      History   Chief Complaint Chief Complaint  Patient presents with   Emesis   Nausea    HPI Brandi Church is a 23 y.o. female.   Patient presents for evaluation of centralized abdominal pain and nausea with vomiting beginning today around 10 AM.  Last occurrence of vomiting 10 minutes ago.  Unable to tolerate food or liquids at this time.  Denies fever, URI symptoms, diarrhea or constipation.  Denies changes in diet or recent travel, no known sick contacts with similar symptoms.  Has not attempted treatment.  Vomiitn abd pain started today, last occurrence   Past Medical History:  Diagnosis Date   ADHD (attention deficit hyperactivity disorder)    Anemia    Asthma    Complication of anesthesia    Depression    GAD (generalized anxiety disorder)    Headache disorder 06/22/2020   History of RSV infection 10/20/2022   ED visit in epic positive test and dx viral tonsillitis   History of tonsillitis 11/24/2022   (12-25-2022 per pt symptoms completely resolved)  ED visit in epic, can't breath or swallow due to enlarged tonsill's , dx acute pharyngitis;   in epic per  PCP note 11-26-2022  dx tonsillitis w/ exudate , given prednisone  pack   MDD (major depressive disorder)    12/28/22   MDD (major depressive disorder), recurrent episode, moderate (HCC) 01/19/2021   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/26/2023   PONV (postoperative nausea and vomiting)    Supervision of normal first pregnancy 08/16/2021              Nursing Staff    Provider      Office Location     [facility]    Dating     LMP      Language      ENGLISH    Anatomy US      CPC, resolved on follow-up      Flu Vaccine     08/14/2021    Genetic/Carrier Screen     NIPS: low risk  AFP:   Screen neg  Horizon: negative      TDaP Vaccine      12/20/2021    Hgb A1C or   GTT    Early   Third trimester 74-92-84      COVID Vaccine     YES         L    Unintended weight loss 10/08/2022    Patient Active Problem List   Diagnosis Date Noted   Other constipation 12/16/2023   Acid indigestion 12/16/2023   Iron  deficiency anemia 07/31/2023   Attention deficit hyperactivity disorder (ADHD), predominantly inattentive type 01/19/2021   Generalized anxiety disorder with panic attacks 12/09/2020   Asthma 05/26/2019    Past Surgical History:  Procedure Laterality Date   LABIOPLASTY N/A 12/31/2022   Procedure: LABIAPLASTY;  Surgeon: Renea Carrion, MD;  Location: [facility] OR;  Service: Gynecology;  Laterality: N/A;   NO PAST SURGERIES     TONSILLECTOMY     WISDOM TOOTH EXTRACTION     removed on 08/16/2022.  11/28/2021    OB History     Gravida  1   Para  1   Term  1   Preterm  0   AB  0   Living  1      SAB  0   IAB  0  Ectopic  0   Multiple  0   Live Births  1            Home Medications    Prior to Admission medications   Medication Sig Start Date End Date Taking? Authorizing Provider  etonogestrel-ethinyl estradiol (NUVARING) 0.12-0.015 MG/24HR vaginal ring Place 1 each vaginally every 28 (twenty-eight) days. Insert vaginally and leave in place for 3 consecutive weeks, then remove for 1 week.    [provider]  ferrous sulfate  325 (65 FE) MG EC tablet Take 1 tablet (325 mg total) by mouth daily with breakfast. 08/08/23   Versa Gore, NP  fluticasone  (FLONASE ) 50 MCG/ACT nasal spray Place 1 spray into both nostrils daily. 02/26/24   Mayer, Jodi R, NP  fluticasone  (FLOVENT  HFA) 110 MCG/ACT inhaler Inhale 2 puffs into the lungs daily as needed (asthma).    [provider]  hydrOXYzine  (ATARAX ) 10 MG tablet Take by mouth. 04/11/21   [provider]  ibuprofen  (ADVIL ) 800 MG tablet Take 1 tablet (800 mg total) by mouth every 6 (six) hours as needed for headache or moderate pain (pain score 4-6). 02/02/24   Fleming, Zelda W, NP  triamcinolone  cream (KENALOG ) 0.1 % Apply 1 Application  topically 2 (two) times daily. Mix a small drop with a drop of facial moisturizer and apply to facial rash. 03/04/24   Versa Gore, NP  VENTOLIN  HFA 108 (90 Base) MCG/ACT inhaler Inhale 2 puffs into the lungs every 4 (four) hours as needed for shortness of breath. 06/23/22   [provider]    Family History Family History  Problem Relation Age of Onset   Depression Mother    Asthma Mother    Depression Father    Asthma Father    Hyperlipidemia Maternal Grandmother    Heart disease Maternal Grandmother    Hypertension Maternal Grandmother    Hyperlipidemia Maternal Grandfather    Heart disease Maternal Grandfather    COPD Maternal Grandfather    Hypertension Maternal Grandfather    Diabetes Maternal Grandfather     Social History Social History   Tobacco Use   Smoking status: Never    Passive exposure: Never   Smokeless tobacco: Never  Vaping Use   Vaping status: Never Used  Substance Use Topics   Alcohol use: Never   Drug use: Never     Allergies   Latex   Review of Systems Review of Systems  HENT: Negative.    Respiratory: Negative.    Cardiovascular: Negative.   Gastrointestinal:  Positive for abdominal pain and vomiting. Negative for abdominal distention, anal bleeding, blood in stool, constipation, diarrhea, nausea and rectal pain.     Physical Exam Triage Vital Signs ED Triage Vitals  Encounter Vitals Group     BP 04/23/24 1208 111/71     Girls Systolic BP Percentile --      Girls Diastolic BP Percentile --      Boys Systolic BP Percentile --      Boys Diastolic BP Percentile --      Pulse Rate 04/23/24 1208 95     Resp 04/23/24 1208 17     Temp 04/23/24 1208 (!) 97.5 F (36.4 C)     Temp Source 04/23/24 1208 Oral     SpO2 04/23/24 1208 96 %     Weight --      Height --      Head Circumference --      Peak Flow --  Pain Score 04/23/24 1207 5     Pain Loc --      Pain Education --      Exclude from Growth Chart --    No  data found.  Updated Vital Signs BP 111/71 (BP Location: Right Arm)   Pulse 95   Temp (!) 97.5 F (36.4 C) (Oral)   Resp 17   LMP 04/16/2024 (Exact Date)   SpO2 96%   Visual Acuity Right Eye Distance:   Left Eye Distance:   Bilateral Distance:    Right Eye Near:   Left Eye Near:    Bilateral Near:     Physical Exam Constitutional:      Appearance: Normal appearance.  Pulmonary:     Effort: Pulmonary effort is normal.     Breath sounds: Normal breath sounds.  Abdominal:     General: Abdomen is flat. Bowel sounds are increased.     Palpations: Abdomen is soft.     Tenderness: There is abdominal tenderness in the epigastric area.   Neurological:     Mental Status: She is alert and oriented to person, place, and time. Mental status is at baseline.      UC Treatments / Results  Labs (all labs ordered are listed, but only abnormal results are displayed) Labs Reviewed - No data to display  EKG   Radiology No results found.  Procedures Procedures (including critical care time)  Medications Ordered in UC Medications  ondansetron  (ZOFRAN ) injection 4 mg (has no administration in time range)    Initial Impression / Assessment and Plan / UC Course  I have reviewed the triage vital signs and the nursing notes.  Pertinent labs & imaging results that were available during my care of the patient were reviewed by me and considered in my medical decision making (see chart for details).  Viral gastroenteritis  Vital signs are stable, patient in no signs of distress nontoxic-appearing, increased bowel sounds with epigastric tenderness noted on exam, not guarding, able to sit comfortably within exam room without issue, stable for outpatient management, Zofran  ODT given on reevaluation able to tolerate fluids, prescribed for home use and advised increase fluid intake with food titrated, may follow-up with urgent care as needed Final Clinical Impressions(s) / UC Diagnoses    Final diagnoses:  None   Discharge Instructions   None    ED Prescriptions   None    PDMP not reviewed this encounter.   Reena Canning, NP 04/23/24 1246

## 2024-05-04 ENCOUNTER — Ambulatory Visit (INDEPENDENT_AMBULATORY_CARE_PROVIDER_SITE_OTHER): Admitting: Family

## 2024-05-04 ENCOUNTER — Encounter: Payer: Self-pay | Admitting: Family

## 2024-05-04 ENCOUNTER — Other Ambulatory Visit (HOSPITAL_COMMUNITY)
Admission: RE | Admit: 2024-05-04 | Discharge: 2024-05-04 | Disposition: A | Discharge status: Home / Self Care | Source: Ambulatory Visit | Attending: Family | Admitting: Family

## 2024-05-04 VITALS — BP 100/68 | HR 95 | Ht 64.0 in | Wt 134.2 lb

## 2024-05-04 DIAGNOSIS — B3731 Acute candidiasis of vulva and vagina: Secondary | ICD-10-CM | POA: Insufficient documentation

## 2024-05-04 MED ORDER — FLUCONAZOLE 150 MG PO TABS
ORAL_TABLET | ORAL | 0 refills | Status: DC
Start: 2024-05-04 — End: 2024-09-21

## 2024-05-04 NOTE — Progress Notes (Signed)
 Patient ID: Brandi Church, female    DOB: 02/21/2001, 23 y.o.   MRN: [id number]  Chief Complaint  Patient presents with   Vaginitis    Notice this n yesterday , she stated that she is white thick discharge ,itching   Discussed the use of AI scribe software for clinical note transcription with the patient, who gave verbal consent to proceed.  History of Present Illness Brandi Church is a 23 year old female who presents with vaginal discharge and itching.  She experiences thick, odorless vaginal discharge and itching, which she associates with wearing tight clothing and a wet bathing suit. This episode differs from previous bacterial vaginitis, which involved a thinner, yellowish discharge with a fishy odor. She underwent a full panel STD testing two weeks ago, which returned normal results. She has been prescribed Diflucan  in the past for yeast infections. No smelly discharge is present.  Assessment & Plan Vulvovaginal Candidiasis Symptoms suggest vulvovaginal candidiasis, likely worsened by tight and moist clothing. Differential diagnosis includes bacterial vaginosis, but yeast infection is more probable. Recent  STD tests were negative. - Perform swab to confirm yeast infection. - Prescribe Diflucan  (fluconazole ), one pill today, second in three days. - Advise avoiding tight clothing and prompt change from wet bathing suits or moist underwear. - Call office back if symptoms are not improved after finishing Diflucan .   Subjective:    Outpatient Medications Prior to Visit  Medication Sig Dispense Refill   etonogestrel-ethinyl estradiol (NUVARING) 0.12-0.015 MG/24HR vaginal ring Place 1 each vaginally every 28 (twenty-eight) days. Insert vaginally and leave in place for 3 consecutive weeks, then remove for 1 week.     fluticasone  (FLOVENT  HFA) 110 MCG/ACT inhaler Inhale 2 puffs into the lungs daily as needed (asthma).     hydrOXYzine  (ATARAX ) 10 MG tablet Take by mouth.     ibuprofen   (ADVIL ) 800 MG tablet Take 1 tablet (800 mg total) by mouth every 6 (six) hours as needed for headache or moderate pain (pain score 4-6). 32 tablet 0   ondansetron  (ZOFRAN -ODT) 4 MG disintegrating tablet Take 1 tablet (4 mg total) by mouth every 8 (eight) hours as needed for nausea or vomiting. 20 tablet 0   VENTOLIN  HFA 108 (90 Base) MCG/ACT inhaler Inhale 2 puffs into the lungs every 4 (four) hours as needed for shortness of breath.     ferrous sulfate  325 (65 FE) MG EC tablet Take 1 tablet (325 mg total) by mouth daily with breakfast. (Patient not taking: Reported on 05/04/2024) 90 tablet 1   fluticasone  (FLONASE ) 50 MCG/ACT nasal spray Place 1 spray into both nostrils daily. (Patient not taking: Reported on 05/04/2024) 15.8 mL 0   triamcinolone  cream (KENALOG ) 0.1 % Apply 1 Application topically 2 (two) times daily. Mix a small drop with a drop of facial moisturizer and apply to facial rash. (Patient not taking: Reported on 05/04/2024) 30 g 0   No facility-administered medications prior to visit.   Past Medical History:  Diagnosis Date   ADHD (attention deficit hyperactivity disorder)    Anemia    Asthma    Complication of anesthesia    Depression    GAD (generalized anxiety disorder)    Headache disorder 06/22/2020   History of RSV infection 10/20/2022   ED visit in epic positive test and dx viral tonsillitis   History of tonsillitis 11/24/2022   (12-25-2022 per pt symptoms completely resolved)  ED visit in epic, can't breath or swallow due to enlarged tonsill's , dx acute  pharyngitis;   in epic per  PCP note 11-26-2022  dx tonsillitis w/ exudate , given prednisone  pack   MDD (major depressive disorder)    12/28/22   MDD (major depressive disorder), recurrent episode, moderate (HCC) 01/19/2021   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/26/2023   PONV (postoperative nausea and vomiting)    Supervision of normal first pregnancy 08/16/2021              Nursing Staff    Provider       Office Location     [facility]    Dating     LMP      Language      ENGLISH    Anatomy US      [facility], resolved on follow-up      Flu Vaccine     08/14/2021    Genetic/Carrier Screen     NIPS: low risk  AFP:   Screen neg  Horizon: negative      TDaP Vaccine      12/20/2021    Hgb A1C or   GTT    Early   Third trimester 74-92-84      COVID Vaccine     YES         L   Unintended weight loss 10/08/2022   Past Surgical History:  Procedure Laterality Date   LABIOPLASTY N/A 12/31/2022   Procedure: LABIAPLASTY;  Surgeon: Henry Slough, MD;  Location: [facility] OR;  Service: Gynecology;  Laterality: N/A;   NO PAST SURGERIES     TONSILLECTOMY     WISDOM TOOTH EXTRACTION     removed on 08/16/2022.  11/28/2021   Allergies  Allergen Reactions   Latex Itching and Swelling    Reports swelling of mouth and itching      Objective:    Physical Exam Vitals and nursing note reviewed.  Constitutional:      Appearance: Normal appearance.   Cardiovascular:     Rate and Rhythm: Normal rate and regular rhythm.  Pulmonary:     Effort: Pulmonary effort is normal.     Breath sounds: Normal breath sounds.   Musculoskeletal:        General: Normal range of motion.   Skin:    General: Skin is warm and dry.   Neurological:     Mental Status: She is alert.   Psychiatric:        Mood and Affect: Mood normal.        Behavior: Behavior normal.    BP 100/68   Pulse 95   Ht 5' 4 (1.626 m)   Wt 134 lb 3.2 oz (60.9 kg)   LMP 04/16/2024 (Exact Date)   SpO2 92%   BMI 23.04 kg/m  Wt Readings from Last 3 Encounters:  05/04/24 134 lb 3.2 oz (60.9 kg)  03/04/24 138 lb 3.2 oz (62.7 kg)  01/01/24 133 lb (60.3 kg)       Lucius Krabbe, NP

## 2024-05-05 LAB — CERVICOVAGINAL ANCILLARY ONLY
Bacterial Vaginitis (gardnerella): POSITIVE — AB
Candida Glabrata: NEGATIVE
Candida Vaginitis: POSITIVE — AB
Chlamydia: NEGATIVE
Comment: NEGATIVE
Comment: NEGATIVE
Comment: NEGATIVE
Comment: NEGATIVE
Comment: NEGATIVE
Comment: NORMAL
Neisseria Gonorrhea: NEGATIVE
Trichomonas: NEGATIVE

## 2024-05-06 ENCOUNTER — Ambulatory Visit: Payer: Self-pay | Admitting: Family

## 2024-05-06 DIAGNOSIS — B9689 Other specified bacterial agents as the cause of diseases classified elsewhere: Secondary | ICD-10-CM

## 2024-05-07 MED ORDER — METRONIDAZOLE 500 MG PO TABS: 500.0000 mg | ORAL_TABLET | Freq: Two times a day (BID) | ORAL | 0 refills | Status: AC

## 2024-05-16 DIAGNOSIS — Z419 Encounter for procedure for purposes other than remedying health state, unspecified: Secondary | ICD-10-CM | POA: Diagnosis not present

## 2024-06-02 DIAGNOSIS — O039 Complete or unspecified spontaneous abortion without complication: Secondary | ICD-10-CM | POA: Diagnosis not present

## 2024-06-02 DIAGNOSIS — Z331 Pregnant state, incidental: Secondary | ICD-10-CM | POA: Diagnosis not present

## 2024-06-02 DIAGNOSIS — F419 Anxiety disorder, unspecified: Secondary | ICD-10-CM | POA: Diagnosis not present

## 2024-06-02 DIAGNOSIS — Z91040 Latex allergy status: Secondary | ICD-10-CM | POA: Diagnosis not present

## 2024-06-02 DIAGNOSIS — O3680X9 Pregnancy with inconclusive fetal viability, other fetus: Secondary | ICD-10-CM | POA: Diagnosis not present

## 2024-06-02 DIAGNOSIS — O208 Other hemorrhage in early pregnancy: Secondary | ICD-10-CM | POA: Diagnosis not present

## 2024-06-02 DIAGNOSIS — G43909 Migraine, unspecified, not intractable, without status migrainosus: Secondary | ICD-10-CM | POA: Diagnosis not present

## 2024-06-08 DIAGNOSIS — Z3201 Encounter for pregnancy test, result positive: Secondary | ICD-10-CM | POA: Diagnosis not present

## 2024-06-16 DIAGNOSIS — Z419 Encounter for procedure for purposes other than remedying health state, unspecified: Secondary | ICD-10-CM | POA: Diagnosis not present

## 2024-06-17 ENCOUNTER — Ambulatory Visit (INDEPENDENT_AMBULATORY_CARE_PROVIDER_SITE_OTHER): Admitting: Family

## 2024-06-17 ENCOUNTER — Ambulatory Visit: Admitting: Family

## 2024-06-17 ENCOUNTER — Encounter: Payer: Self-pay | Admitting: Family

## 2024-06-17 VITALS — BP 111/71 | HR 74 | Temp 97.3°F | Ht 64.0 in | Wt 138.5 lb

## 2024-06-17 DIAGNOSIS — F411 Generalized anxiety disorder: Secondary | ICD-10-CM

## 2024-06-17 DIAGNOSIS — D500 Iron deficiency anemia secondary to blood loss (chronic): Secondary | ICD-10-CM

## 2024-06-17 DIAGNOSIS — F410 Panic disorder [episodic paroxysmal anxiety] without agoraphobia: Secondary | ICD-10-CM | POA: Diagnosis not present

## 2024-06-17 DIAGNOSIS — F900 Attention-deficit hyperactivity disorder, predominantly inattentive type: Secondary | ICD-10-CM

## 2024-06-17 LAB — CBC WITH DIFFERENTIAL/PLATELET
Basophils Absolute: 0 K/uL (ref 0.0–0.1)
Basophils Relative: 0.7 % (ref 0.0–3.0)
Eosinophils Absolute: 0.1 K/uL (ref 0.0–0.7)
Eosinophils Relative: 2.5 % (ref 0.0–5.0)
HCT: 33.1 % — ABNORMAL LOW (ref 36.0–46.0)
Hemoglobin: 10.3 g/dL — ABNORMAL LOW (ref 12.0–15.0)
Lymphocytes Relative: 45 % (ref 12.0–46.0)
Lymphs Abs: 1.7 K/uL (ref 0.7–4.0)
MCHC: 31 g/dL (ref 30.0–36.0)
MCV: 74.3 fl — ABNORMAL LOW (ref 78.0–100.0)
Monocytes Absolute: 0.2 K/uL (ref 0.1–1.0)
Monocytes Relative: 5.4 % (ref 3.0–12.0)
Neutro Abs: 1.7 K/uL (ref 1.4–7.7)
Neutrophils Relative %: 46.4 % (ref 43.0–77.0)
Platelets: 264 K/uL (ref 150.0–400.0)
RBC: 4.46 Mil/uL (ref 3.87–5.11)
RDW: 15.3 % (ref 11.5–15.5)
WBC: 3.8 K/uL — ABNORMAL LOW (ref 4.0–10.5)

## 2024-06-17 MED ORDER — LISDEXAMFETAMINE DIMESYLATE 30 MG PO CAPS: 30.0000 mg | ORAL_CAPSULE | Freq: Every day | ORAL | 0 refills | Status: DC

## 2024-06-17 MED ORDER — ESCITALOPRAM OXALATE 10 MG PO TABS: 10.0000 mg | ORAL_TABLET | Freq: Every day | ORAL | 1 refills | Status: DC

## 2024-06-17 NOTE — Assessment & Plan Note (Signed)
 She is on escitalopram  10 mg but has faced pharmacy shortages, increasing her anxiety. She is aware of the risks of abrupt discontinuation. - Send refill of escitalopram  10 mg to a different pharmacy. - Advise her to contact the pharmacy to confirm availability. - Offer to send prescription to the clinic's pharmacy, which can deliver or mail the medication. - F/U 57m or not needed if seen by [facility] provider

## 2024-06-17 NOTE — Progress Notes (Signed)
 Patient ID: Brandi Church, female    DOB: 05/11/2001, 23 y.o.   MRN: [id number]  Chief Complaint  Patient presents with   ADHD    Pt states she is going back to school next week and would like refill of Vyvanse  30 MG.    Depression    F/u    Anemia  Discussed the use of AI scribe software for clinical note transcription with the patient, who gave verbal consent to proceed.  History of Present Illness Brandi Church is a 23 year old female who presents for medication management and follow-up.  Attention deficit hyperactivity disorder (adhd) symptoms and stimulant medication management - Currently taking Vyvanse  30 mg for ADHD, prescribed by a [place] behavioral [facility] - Has had a surplus of Vyvanse  due to automatic refills - Has not visited the prescribing provider since last fall - Attempting to schedule a follow-up appointment; may consider a walk-in visit if unable to schedule  Depressive and anxiety symptoms and antidepressant medication access - Taking Lexapro  10 mg for depression and anxiety, prescribed by above provider also - Difficulty obtaining Lexapro  from [facility] due to a medication shortage - Concerned about running out of Lexapro  as she returns to school next week - Lack of medication also caused severe HA and other SE - Has not contacted the pharmacy to check medication availability  Iron  deficiency and supplement intolerance - Not taking iron  supplements due to severe constipation - Slow-release iron  formulation may need to be purchased over the counter, as it is not covered by Medicaid  Assessment & Plan Attention-deficit hyperactivity disorder (ADHD) She has been using leftover Vyvanse  30 mg due to inconsistent provider access and plans to follow up with her previous provider. - Send one refill of Vyvanse  30 mg generic. - Advise her to follow up with her previous provider for ongoing management.  Major depressive disorder and anxiety disorder She is on  escitalopram  10 mg but has faced pharmacy shortages, increasing her anxiety. She is aware of the risks of abrupt discontinuation. - Send refill of escitalopram  10 mg to a different pharmacy. - Advise her to contact the pharmacy to confirm availability. - Offer to send prescription to the clinic's pharmacy, which can deliver or mail the medication. - F/U 81m or not needed if seen by [facility] provider  Iron  deficiency She discontinued her iron  supplement due to constipation. A slow-release supplement is recommended. - Recheck iron  level today. - Recommend slow-release iron  supplement to minimize gastrointestinal side effects to be obtained over the counter if insurance does not cover. - Advised to look for 'slow release' on the bottle and use a lower dose 45mg  vs 65mg . - F/U in 48m or based on lab results   Subjective:    Outpatient Medications Prior to Visit  Medication Sig Dispense Refill   escitalopram  (LEXAPRO ) 10 MG tablet Take 10 mg by mouth.     etonogestrel-ethinyl estradiol (NUVARING) 0.12-0.015 MG/24HR vaginal ring Place 1 each vaginally every 28 (twenty-eight) days. Insert vaginally and leave in place for 3 consecutive weeks, then remove for 1 week.     ferrous sulfate  325 (65 FE) MG EC tablet Take 1 tablet (325 mg total) by mouth daily with breakfast. (Patient not taking: Reported on 05/04/2024) 90 tablet 1   fluconazole  (DIFLUCAN ) 150 MG tablet Take 1 pill today and the 2nd pill in 3 days. 2 tablet 0   fluticasone  (FLOVENT  HFA) 110 MCG/ACT inhaler Inhale 2 puffs into the lungs daily as needed (asthma).  hydrOXYzine  (ATARAX ) 10 MG tablet Take by mouth.     ibuprofen  (ADVIL ) 800 MG tablet Take 1 tablet (800 mg total) by mouth every 6 (six) hours as needed for headache or moderate pain (pain score 4-6). 32 tablet 0   ondansetron  (ZOFRAN -ODT) 4 MG disintegrating tablet Take 1 tablet (4 mg total) by mouth every 8 (eight) hours as needed for nausea or vomiting. 20 tablet 0   VENTOLIN  HFA  108 (90 Base) MCG/ACT inhaler Inhale 2 puffs into the lungs every 4 (four) hours as needed for shortness of breath.     fluticasone  (FLONASE ) 50 MCG/ACT nasal spray Place 1 spray into both nostrils daily. (Patient not taking: Reported on 05/04/2024) 15.8 mL 0   triamcinolone  cream (KENALOG ) 0.1 % Apply 1 Application topically 2 (two) times daily. Mix a small drop with a drop of facial moisturizer and apply to facial rash. (Patient not taking: Reported on 05/04/2024) 30 g 0   No facility-administered medications prior to visit.   Past Medical History:  Diagnosis Date   ADHD (attention deficit hyperactivity disorder)    Anemia    Asthma    Complication of anesthesia    Depression    GAD (generalized anxiety disorder)    Headache disorder 06/22/2020   History of RSV infection 10/20/2022   ED visit in epic positive test and dx viral tonsillitis   History of tonsillitis 11/24/2022   (12-25-2022 per pt symptoms completely resolved)  ED visit in epic, can't breath or swallow due to enlarged tonsill's , dx acute pharyngitis;   in epic per  PCP note 11-26-2022  dx tonsillitis w/ exudate , given prednisone  pack   MDD (major depressive disorder)    12/28/22   MDD (major depressive disorder), recurrent episode, moderate (HCC) 01/19/2021   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/26/2023   PONV (postoperative nausea and vomiting)    Supervision of normal first pregnancy 08/16/2021              Nursing Staff    Provider      Office Location     [facility]    Dating     LMP      Language      ENGLISH    Anatomy US      [facility], resolved on follow-up      Flu Vaccine     08/14/2021    Genetic/Carrier Screen     NIPS: low risk  AFP:   Screen neg  Horizon: negative      TDaP Vaccine      12/20/2021    Hgb A1C or   GTT    Early   Third trimester 74-92-84      COVID Vaccine     YES         L   Unintended weight loss 10/08/2022   Past Surgical History:  Procedure Laterality Date   LABIOPLASTY N/A 12/31/2022    Procedure: LABIAPLASTY;  Surgeon: Henry Slough, MD;  Location: [facility] OR;  Service: Gynecology;  Laterality: N/A;   NO PAST SURGERIES     TONSILLECTOMY     WISDOM TOOTH EXTRACTION     removed on 08/16/2022.  11/28/2021   Allergies  Allergen Reactions   Latex Itching and Swelling    Reports swelling of mouth and itching      Objective:    Physical Exam Vitals and nursing note reviewed.  Constitutional:      Appearance: Normal appearance.  Cardiovascular:     Rate and  Rhythm: Normal rate and regular rhythm.  Pulmonary:     Effort: Pulmonary effort is normal.     Breath sounds: Normal breath sounds.  Musculoskeletal:        General: Normal range of motion.  Skin:    General: Skin is warm and dry.  Neurological:     Mental Status: She is alert.  Psychiatric:        Mood and Affect: Mood normal.        Behavior: Behavior normal.    BP 111/71 (BP Location: Left Arm, Patient Position: Sitting, Cuff Size: Normal)   Pulse 74   Temp (!) 97.3 F (36.3 C) (Temporal)   Ht 5' 4 (1.626 m)   Wt 138 lb 8 oz (62.8 kg)   LMP 06/03/2024 (Exact Date)   SpO2 100%   BMI 23.77 kg/m  Wt Readings from Last 3 Encounters:  06/17/24 138 lb 8 oz (62.8 kg)  05/04/24 134 lb 3.2 oz (60.9 kg)  03/04/24 138 lb 3.2 oz (62.7 kg)      Lucius Krabbe, NP

## 2024-06-17 NOTE — Assessment & Plan Note (Signed)
 She has been using leftover Vyvanse  30 mg due to inconsistent provider access and plans to follow up with her previous provider. - Send one refill of Vyvanse  30 mg generic. - Advise her to follow up with her previous provider for ongoing management.

## 2024-06-17 NOTE — Assessment & Plan Note (Signed)
 She discontinued her iron  supplement due to constipation. A slow-release supplement is recommended. - Recheck iron  level today. - Recommend slow-release iron  supplement to minimize gastrointestinal side effects to be obtained over the counter if insurance does not cover. - Advised to look for 'slow release' on the bottle and use a lower dose 45mg  vs 65mg . - F/U in 85m or based on lab results

## 2024-06-18 ENCOUNTER — Ambulatory Visit: Payer: Self-pay | Admitting: Family

## 2024-06-18 DIAGNOSIS — K5903 Drug induced constipation: Secondary | ICD-10-CM

## 2024-06-18 DIAGNOSIS — D500 Iron deficiency anemia secondary to blood loss (chronic): Secondary | ICD-10-CM

## 2024-06-18 MED ORDER — SLOW RELEASE IRON 45 MG PO TBCR: 1.0000 | EXTENDED_RELEASE_TABLET | Freq: Every day | ORAL | 5 refills | Status: DC

## 2024-06-18 MED ORDER — DOCUSATE SODIUM 100 MG PO CAPS
100.0000 mg | ORAL_CAPSULE | Freq: Every day | ORAL | 5 refills | Status: DC | PRN
Start: 2024-06-18 — End: 2024-09-21

## 2024-06-22 DIAGNOSIS — M79671 Pain in right foot: Secondary | ICD-10-CM | POA: Diagnosis not present

## 2024-07-11 ENCOUNTER — Other Ambulatory Visit: Payer: Self-pay | Admitting: Family

## 2024-07-11 DIAGNOSIS — F410 Panic disorder [episodic paroxysmal anxiety] without agoraphobia: Secondary | ICD-10-CM

## 2024-07-17 DIAGNOSIS — Z419 Encounter for procedure for purposes other than remedying health state, unspecified: Secondary | ICD-10-CM | POA: Diagnosis not present

## 2024-07-28 DIAGNOSIS — A5600 Chlamydial infection of lower genitourinary tract, unspecified: Secondary | ICD-10-CM | POA: Diagnosis not present

## 2024-07-28 DIAGNOSIS — Z113 Encounter for screening for infections with a predominantly sexual mode of transmission: Secondary | ICD-10-CM | POA: Diagnosis not present

## 2024-07-28 DIAGNOSIS — Z7251 High risk heterosexual behavior: Secondary | ICD-10-CM | POA: Diagnosis not present

## 2024-07-28 DIAGNOSIS — N390 Urinary tract infection, site not specified: Secondary | ICD-10-CM | POA: Diagnosis not present

## 2024-07-28 DIAGNOSIS — Z3202 Encounter for pregnancy test, result negative: Secondary | ICD-10-CM | POA: Diagnosis not present

## 2024-07-28 DIAGNOSIS — Z202 Contact with and (suspected) exposure to infections with a predominantly sexual mode of transmission: Secondary | ICD-10-CM | POA: Diagnosis not present

## 2024-07-28 DIAGNOSIS — A749 Chlamydial infection, unspecified: Secondary | ICD-10-CM | POA: Diagnosis not present

## 2024-08-07 ENCOUNTER — Ambulatory Visit: Payer: Self-pay

## 2024-08-07 ENCOUNTER — Ambulatory Visit: Admitting: Family

## 2024-08-07 NOTE — Telephone Encounter (Signed)
 FYI Only or Action Required?: FYI only for provider.  Patient was last seen in primary care on 06/17/2024 by Lucius Krabbe, NP.  Called Nurse Triage reporting Palpitations.  Symptoms began a week ago.  Interventions attempted: Rest, hydration, or home remedies.  Symptoms are: gradually worsening.  Triage Disposition: See Physician Within 24 Hours  Patient/caregiver understands and will follow disposition?: Yes Appt scheduled with PCP for today at 1 pm.  Additional Information  Commented on: Heart rhythm alert (e.g., you have irregular heartbeat) from personal wearable device (e.g., Apple Watch)    New onset left sided 4/10 aching chest pain, worsening palpitations.  Answer Assessment - Initial Assessment Questions 1. DESCRIPTION: Please describe your heart rate or heartbeat that you are having (e.g., fast/slow, regular/irregular, skipped or extra beats, palpitations)     Racing heart. Resting HR 104. BP 110/60 yesterday.  2. ONSET: When did it start? (e.g., minutes, hours, days)      Pain started last week, palpitations have been ongoing for several years. Worse in past week with onset of chest pain.  3. DURATION: How long does it last (e.g., seconds, minutes, hours)     With exertion, pain and palpitations resolves with rest. Comes and goes.  4. PATTERN Does it come and go, or has it been constant since it started?  Does it get worse with exertion?   Are you feeling it now?     Worse with exertion, improves with rest.  5. TAP: Using your hand, can you tap out what you are feeling on a chair or table in front of you, so that I can hear? Note: Not all patients can do this.       *No Answer*  6. HEART RATE: Can you tell me your heart rate? How many beats in 15 seconds?  Note: Not all patients can do this.       56  7. RECURRENT SYMPTOM: Have you ever had this before? If Yes, ask: When was the last time? and What happened that time?      Hx  palpitations for several years  8. CAUSE: What do you think is causing the palpitations?     Unsure. Possibly r/t anxiety  9. CARDIAC HISTORY: Do you have any history of heart disease? (e.g., heart attack, angina, bypass surgery, angioplasty, arrhythmia)      Palpitations. Seen by cardiologist. No medication.  10. OTHER SYMPTOMS: Do you have any other symptoms? (e.g., dizziness, chest pain, sweating, difficulty breathing)       4/10 ache in left side of chest  11. PREGNANCY: Is there any chance you are pregnant? When was your last menstrual period?       No. LMP 9/20  Protocols used: Heart Rate and Heartbeat Questions-A-AH

## 2024-08-07 NOTE — Telephone Encounter (Signed)
 Appt today

## 2024-08-11 ENCOUNTER — Encounter: Payer: Self-pay | Admitting: Family

## 2024-08-13 DIAGNOSIS — Z113 Encounter for screening for infections with a predominantly sexual mode of transmission: Secondary | ICD-10-CM | POA: Diagnosis not present

## 2024-08-13 DIAGNOSIS — N898 Other specified noninflammatory disorders of vagina: Secondary | ICD-10-CM | POA: Diagnosis not present

## 2024-08-13 DIAGNOSIS — N760 Acute vaginitis: Secondary | ICD-10-CM | POA: Diagnosis not present

## 2024-08-18 DIAGNOSIS — G5602 Carpal tunnel syndrome, left upper limb: Secondary | ICD-10-CM | POA: Diagnosis not present

## 2024-08-18 DIAGNOSIS — M79642 Pain in left hand: Secondary | ICD-10-CM | POA: Diagnosis not present

## 2024-09-16 DIAGNOSIS — Z113 Encounter for screening for infections with a predominantly sexual mode of transmission: Secondary | ICD-10-CM | POA: Diagnosis not present

## 2024-09-16 DIAGNOSIS — R300 Dysuria: Secondary | ICD-10-CM | POA: Diagnosis not present

## 2024-09-16 DIAGNOSIS — R319 Hematuria, unspecified: Secondary | ICD-10-CM | POA: Diagnosis not present

## 2024-09-16 DIAGNOSIS — N899 Noninflammatory disorder of vagina, unspecified: Secondary | ICD-10-CM | POA: Diagnosis not present

## 2024-09-20 NOTE — Progress Notes (Unsigned)
 Cardiology Office Note:    Date:  09/21/2024   ID:  Brandi Church, DOB 15-Feb-2001, MRN [id number]  PCP:  Lucius Krabbe, NP  Cardiologist:  None  Electrophysiologist:  None   Referring MD: Lucius Krabbe, NP   Chief Complaint  Patient presents with   Palpitations    History of Present Illness:    Brandi Church is a 23 y.o. female with a hx of ADHD, asthma, anemia who is referred by Krabbe Lucius NP for evaluation of palpitations.  She reports has been having palpitations for years.  Occurs daily, particularly at night feels like heart is racing and can skip beats.  Typically last for 10 minutes or so.  Feels lightheaded during episodes but denies any syncope.  Also reports feeling short of breath.  She goes to the gym and does Pilates for exercise.  Reports with exertion she will have chest pain.  States that if she tries running on treadmill she will feel chest pain and short of breath.  No smoking history.  No history of heart disease in her immediate family.  Past Medical History:  Diagnosis Date   ADHD (attention deficit hyperactivity disorder)    Anemia    Asthma    Complication of anesthesia    Depression    GAD (generalized anxiety disorder)    Headache disorder 06/22/2020   History of RSV infection 10/20/2022   ED visit in epic positive test and dx viral tonsillitis   History of tonsillitis 11/24/2022   (12-25-2022 per pt symptoms completely resolved)  ED visit in epic, can't breath or swallow due to enlarged tonsill's , dx acute pharyngitis;   in epic per  PCP note 11-26-2022  dx tonsillitis w/ exudate , given prednisone  pack   MDD (major depressive disorder)    12/28/22   MDD (major depressive disorder), recurrent episode, moderate (HCC) 01/19/2021   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/26/2023   PONV (postoperative nausea and vomiting)    Supervision of normal first pregnancy 08/16/2021              Nursing Staff    Provider      Office Location      [facility]    Dating     LMP      Language      ENGLISH    Anatomy US      CPC, resolved on follow-up      Flu Vaccine     08/14/2021    Genetic/Carrier Screen     NIPS: low risk  AFP:   Screen neg  Horizon: negative      TDaP Vaccine      12/20/2021    Hgb A1C or   GTT    Early   Third trimester [id number]      COVID Vaccine     YES         L   Unintended weight loss 10/08/2022    Past Surgical History:  Procedure Laterality Date   LABIOPLASTY N/A 12/31/2022   Procedure: LABIAPLASTY;  Surgeon: Henry Slough, MD;  Location: [facility] OR;  Service: Gynecology;  Laterality: N/A;   NO PAST SURGERIES     TONSILLECTOMY     WISDOM TOOTH EXTRACTION     removed on 08/16/2022.  11/28/2021    Current Medications: Current Meds  Medication Sig   escitalopram  (LEXAPRO ) 10 MG tablet TAKE 1 TABLET BY MOUTH EVERY DAY   etonogestrel-ethinyl estradiol (NUVARING) 0.12-0.015 MG/24HR vaginal ring Place 1 each vaginally every 28 (  twenty-eight) days. Insert vaginally and leave in place for 3 consecutive weeks, then remove for 1 week.   ibuprofen  (ADVIL ) 800 MG tablet Take 1 tablet (800 mg total) by mouth every 6 (six) hours as needed for headache or moderate pain (pain score 4-6).   lisdexamfetamine (VYVANSE ) 30 MG capsule Take 1 capsule (30 mg total) by mouth daily.     Allergies:   Latex   Social History   Socioeconomic History   Marital status: Single    Spouse name: Not on file   Number of children: Not on file   Years of education: Not on file   Highest education level: Some college, no degree  Occupational History   Not on file  Tobacco Use   Smoking status: Never    Passive exposure: Never   Smokeless tobacco: Never  Vaping Use   Vaping status: Never Used  Substance and Sexual Activity   Alcohol use: Never   Drug use: Never   Sexual activity: Yes    Birth control/protection: Inserts    Comment: nuvaring  Other Topics Concern   Not on file  Social History Narrative   Not on file   Social  Drivers of Health   Financial Resource Strain: Low Risk  (10/02/2023)   Overall Financial Resource Strain (CARDIA)    Difficulty of Paying Living Expenses: Not hard at all  Food Insecurity: No Food Insecurity (10/02/2023)   Hunger Vital Sign    Worried About Running Out of Food in the Last Year: Never true    Ran Out of Food in the Last Year: Never true  Transportation Needs: No Transportation Needs (10/02/2023)   PRAPARE - Administrator, Civil Service (Medical): No    Lack of Transportation (Non-Medical): No  Physical Activity: Sufficiently Active (10/02/2023)   Exercise Vital Sign    Days of Exercise per Week: 5 days    Minutes of Exercise per Session: 30 min  Recent Concern: Physical Activity - Insufficiently Active (08/12/2023)   Exercise Vital Sign    Days of Exercise per Week: 3 days    Minutes of Exercise per Session: 30 min  Stress: Stress Concern Present (10/02/2023)   [employer] of Occupational Health - Occupational Stress Questionnaire    Feeling of Stress : To some extent  Social Connections: Moderately Isolated (10/02/2023)   Social Connection and Isolation Panel    Frequency of Communication with Friends and Family: More than three times a week    Frequency of Social Gatherings with Friends and Family: More than three times a week    Attends Religious Services: More than 4 times per year    Active Member of [employer] or Organizations: No    Attends Engineer, Structural: Not on file    Marital Status: Never married     Family History: The patient's family history includes Asthma in her father and mother; COPD in her maternal grandfather; Depression in her father and mother; Diabetes in her maternal grandfather; Heart disease in her maternal grandfather and maternal grandmother; Hyperlipidemia in her maternal grandfather and maternal grandmother; Hypertension in her maternal grandfather and maternal grandmother.  ROS:   Please see the history  of present illness.     All other systems reviewed and are negative.  EKGs/Labs/Other Studies Reviewed:    The following studies were reviewed today:   EKG:   09/21/2024: Normal sinus rhythm, rate 85, QTc 402  Recent Labs: 11/02/2023: BUN 6; Creatinine, Ser 0.61; Potassium 3.3;  Sodium 134 06/17/2024: Hemoglobin 10.3; Platelets 264.0  Recent Lipid Panel    Component Value Date/Time   CHOL 177 07/12/2023 1147   TRIG 61.0 07/12/2023 1147   HDL 72.80 07/12/2023 1147   CHOLHDL 2 07/12/2023 1147   VLDL 12.2 07/12/2023 1147   LDLCALC 92 07/12/2023 1147    Physical Exam:    VS:  BP 108/68   Pulse 84   Ht 5' 4 (1.626 m)   Wt 143 lb 6.4 oz (65 kg)   SpO2 99%   BMI 24.61 kg/m     Wt Readings from Last 3 Encounters:  09/21/24 143 lb 6.4 oz (65 kg)  06/17/24 138 lb 8 oz (62.8 kg)  05/04/24 134 lb 3.2 oz (60.9 kg)     GEN:  Well nourished, well developed in no acute distress HEENT: Normal NECK: No JVD; No carotid bruits LYMPHATICS: No lymphadenopathy CARDIAC: RRR, no murmurs, rubs, gallops RESPIRATORY:  Clear to auscultation without rales, wheezing or rhonchi  ABDOMEN: Soft, non-tender, non-distended MUSCULOSKELETAL:  No edema; No deformity  SKIN: Warm and dry NEUROLOGIC:  Alert and oriented x 3 PSYCHIATRIC:  Normal affect   ASSESSMENT:    1. Palpitations   2. Anemia, unspecified type   3. Chest pain of uncertain etiology    PLAN:    Palpitations: Description concerning for arrhythmia, will evaluate with Zio patch x 7 days.  Check echocardiogram to rule out structural heart disease.  Reports she stopped her Vyvanse  but palpitations persist.  Check CMET, mag, TSH  Chest pain: She is reporting exertional chest pain.  EKG interpretable and she is able to exercise.  Plan ETT for further evaluation  Anemia: Microcytic anemia noted on prior labs.  Reports does not have heavy menstural periods, and actually her OB started her on OCPs to stop her menses but her anemia has  persisted.  Will check CBC and iron  labs, if remains anemic will plan referral to hematology for evaluation  RTC in 6 months   Informed Consent   Shared Decision Making/Informed Consent The risks [chest pain, shortness of breath, cardiac arrhythmias, dizziness, blood pressure fluctuations, myocardial infarction, stroke/transient ischemic attack, and life-threatening complications (estimated to be 1 in 10,000)], benefits (risk stratification, diagnosing coronary artery disease, treatment guidance) and alternatives of an exercise tolerance test were discussed in detail with Brandi Church and she agrees to proceed.      Medication Adjustments/Labs and Tests Ordered: Current medicines are reviewed at length with the patient today.  Concerns regarding medicines are outlined above.  Orders Placed This Encounter  Procedures   Iron , TIBC and Ferritin Panel   Comp Met (CMET)   Magnesium   CBC   TSH   Exercise Tolerance Test   LONG TERM MONITOR (3-14 DAYS)   EKG 12-Lead   ECHOCARDIOGRAM COMPLETE   No orders of the defined types were placed in this encounter.   Patient Instructions  Medication Instructions:  Your physician recommends that you continue on your current medications as directed. Please refer to the Current Medication list given to you today.  *If you need a refill on your cardiac medications before your next appointment, please call your pharmacy*  Lab Work: TODAY: CMET, Magnesium, CBC, TSH, Iron , TIBC and Ferritin panel. If you have labs (blood work) drawn today and your tests are completely normal, you will receive your results only by: MyChart Message (if you have MyChart) OR A paper copy in the mail If you have any lab test that is abnormal or  we need to change your treatment, we will call you to review the results.  Testing/Procedures: Your provider has requested you have a Exercise Treadmill Test.  Your physician has requested that you have an echocardiogram.  Echocardiography is a painless test that uses sound waves to create images of your heart. It provides your doctor with information about the size and shape of your heart and how well your heart's chambers and valves are working. This procedure takes approximately one hour. There are no restrictions for this procedure. Please do NOT wear cologne, perfume, aftershave, or lotions (deodorant is allowed). Please arrive 15 minutes prior to your appointment time.  Please note: We ask at that you not bring children with you during ultrasound (echo/ vascular) testing. Due to room size and safety concerns, children are not allowed in the ultrasound rooms during exams. Our front office staff cannot provide observation of children in our lobby area while testing is being conducted. An adult accompanying a patient to their appointment will only be allowed in the ultrasound room at the discretion of the ultrasound technician under special circumstances. We apologize for any inconvenience.   Follow-Up: At [facility], you and your health needs are our priority.  As part of our continuing mission to provide you with exceptional heart care, our providers are all part of one team.  This team includes your primary Cardiologist (physician) and Advanced Practice Providers or APPs (Physician Assistants and Nurse Practitioners) who all work together to provide you with the care you need, when you need it.  Your next appointment:   6 month(s)  Provider:   Dr. Kate  We recommend signing up for the patient portal called MyChart.  Sign up information is provided on this After Visit Summary.  MyChart is used to connect with patients for Virtual Visits (Telemedicine).  Patients are able to view lab/test results, encounter notes, upcoming appointments, etc.  Non-urgent messages can be sent to your provider as well.   To learn more about what you can do with MyChart, go to [email or web address].   Other  Instructions ZIO XT- Long Term Monitor Instructions  Your physician has requested you wear a ZIO patch monitor for __7__ days.   This is a single patch monitor. [employer] supplies one patch monitor per enrollment. Additional  stickers are not available. Please do not apply patch if you will be having a Nuclear Stress Test,  Echocardiogram, Cardiac CT, MRI, or Chest Xray during the period you would be wearing the  monitor. The patch cannot be worn during these tests. You cannot remove and re-apply the  ZIO XT patch monitor.   Your ZIO patch monitor will be mailed 3 day USPS to your address on file. It may take 3-5 days  to receive your monitor after you have been enrolled.   Once you have received your monitor, please review the enclosed instructions. Your monitor  has already been registered assigning a specific monitor serial # to you.     Billing and Patient Assistance Program Information  We have supplied [employer] with any of your insurance information on file for billing purposes.  [employer] offers a sliding scale Patient Assistance Program for patients that do not have  insurance, or whose insurance does not completely cover the cost of the ZIO monitor.  You must apply for the Patient Assistance Program to qualify for this discounted rate.   To apply, please call [employer] at [phone], select option 4, select option 2, ask to apply for  Patient Assistance Program. Brandi Church will ask your household income, and how many people  are in your household. They will quote your out-of-pocket cost based on that information.  [employer] will also be able to set up a 48-month, interest-free payment plan if needed.     Applying the monitor  Shave hair from upper left chest.  Hold abrader disc by orange tab. Rub abrader in 40 strokes over the upper left chest as  indicated in your monitor instructions.  Clean area with 4 enclosed alcohol pads. Let dry.  Apply patch as indicated in monitor  instructions. Patch will be placed under collarbone on left  side of chest with arrow pointing upward.  Rub patch adhesive wings for 2 minutes. Remove white label marked 1. Remove the white  label marked 2. Rub patch adhesive wings for 2 additional minutes.  While looking in a mirror, press and release button in center of patch. A small green light will  flash 3-4 times. This will be your only indicator that the monitor has been turned on.  Do not shower for the first 24 hours. You may shower after the first 24 hours.  Press the button if you feel a symptom. You will hear a small click. Record Date, Time and  Symptom in the Patient Logbook.  When you are ready to remove the patch, follow instructions on the last 2 pages of Patient  Logbook. Stick patch monitor onto the last page of Patient Logbook.   Place Patient Logbook in the blue and white box. Use locking tab on box and tape box closed  securely. The blue and white box has prepaid postage on it. Please place it in the mailbox as  soon as possible. Your physician should have your test results approximately 7 days after the  monitor has been mailed back to [facility].   Call [facility] Customer Care at [phone] if you have questions regarding  your ZIO XT patch monitor. Call them immediately if you see an orange light blinking on your  monitor.   If your monitor falls off in less than 4 days, contact our Monitor department at [phone].   If your monitor becomes loose or falls off after 4 days call [employer] at [phone] for  suggestions on securing your monitor.              Signed, Lonni LITTIE Nanas, MD  09/21/2024 2:44 PM    [facility]

## 2024-09-21 ENCOUNTER — Encounter: Payer: Self-pay | Admitting: Cardiology

## 2024-09-21 ENCOUNTER — Ambulatory Visit: Attending: Cardiology | Admitting: Cardiology

## 2024-09-21 ENCOUNTER — Ambulatory Visit: Attending: Cardiology

## 2024-09-21 VITALS — BP 108/68 | HR 84 | Ht 64.0 in | Wt 143.4 lb

## 2024-09-21 DIAGNOSIS — R079 Chest pain, unspecified: Secondary | ICD-10-CM | POA: Diagnosis not present

## 2024-09-21 DIAGNOSIS — R002 Palpitations: Secondary | ICD-10-CM | POA: Insufficient documentation

## 2024-09-21 DIAGNOSIS — D649 Anemia, unspecified: Secondary | ICD-10-CM | POA: Insufficient documentation

## 2024-09-21 NOTE — Patient Instructions (Signed)
 Medication Instructions:  Your physician recommends that you continue on your current medications as directed. Please refer to the Current Medication list given to you today.  *If you need a refill on your cardiac medications before your next appointment, please call your pharmacy*  Lab Work: TODAY: CMET, Magnesium, CBC, TSH, Iron , TIBC and Ferritin panel. If you have labs (blood work) drawn today and your tests are completely normal, you will receive your results only by: MyChart Message (if you have MyChart) OR A paper copy in the mail If you have any lab test that is abnormal or we need to change your treatment, we will call you to review the results.  Testing/Procedures: Your provider has requested you have a Exercise Treadmill Test.  Your physician has requested that you have an echocardiogram. Echocardiography is a painless test that uses sound waves to create images of your heart. It provides your doctor with information about the size and shape of your heart and how well your heart's chambers and valves are working. This procedure takes approximately one hour. There are no restrictions for this procedure. Please do NOT wear cologne, perfume, aftershave, or lotions (deodorant is allowed). Please arrive 15 minutes prior to your appointment time.  Please note: We ask at that you not bring children with you during ultrasound (echo/ vascular) testing. Due to room size and safety concerns, children are not allowed in the ultrasound rooms during exams. Our front office staff cannot provide observation of children in our lobby area while testing is being conducted. An adult accompanying a patient to their appointment will only be allowed in the ultrasound room at the discretion of the ultrasound technician under special circumstances. We apologize for any inconvenience.   Follow-Up: At [facility], you and your health needs are our priority.  As part of our continuing mission to  provide you with exceptional heart care, our providers are all part of one team.  This team includes your primary Cardiologist (physician) and Advanced Practice Providers or APPs (Physician Assistants and Nurse Practitioners) who all work together to provide you with the care you need, when you need it.  Your next appointment:   6 month(s)  Provider:   Dr. Kate  We recommend signing up for the patient portal called MyChart.  Sign up information is provided on this After Visit Summary.  MyChart is used to connect with patients for Virtual Visits (Telemedicine).  Patients are able to view lab/test results, encounter notes, upcoming appointments, etc.  Non-urgent messages can be sent to your provider as well.   To learn more about what you can do with MyChart, go to [email or web address].   Other Instructions ZIO XT- Long Term Monitor Instructions  Your physician has requested you wear a ZIO patch monitor for __7__ days.   This is a single patch monitor. [employer] supplies one patch monitor per enrollment. Additional  stickers are not available. Please do not apply patch if you will be having a Nuclear Stress Test,  Echocardiogram, Cardiac CT, MRI, or Chest Xray during the period you would be wearing the  monitor. The patch cannot be worn during these tests. You cannot remove and re-apply the  ZIO XT patch monitor.   Your ZIO patch monitor will be mailed 3 day USPS to your address on file. It may take 3-5 days  to receive your monitor after you have been enrolled.   Once you have received your monitor, please review the enclosed instructions. Your monitor  has already  been registered assigning a specific monitor serial # to you.     Billing and Patient Assistance Program Information  We have supplied [employer] with any of your insurance information on file for billing purposes.  [employer] offers a sliding scale Patient Assistance Program for patients that do not have  insurance, or whose  insurance does not completely cover the cost of the ZIO monitor.  You must apply for the Patient Assistance Program to qualify for this discounted rate.   To apply, please call [employer] at [phone], select option 4, select option 2, ask to apply for  Patient Assistance Program. Meredeth will ask your household income, and how many people  are in your household. They will quote your out-of-pocket cost based on that information.  [employer] will also be able to set up a 90-month, interest-free payment plan if needed.     Applying the monitor  Shave hair from upper left chest.  Hold abrader disc by orange tab. Rub abrader in 40 strokes over the upper left chest as  indicated in your monitor instructions.  Clean area with 4 enclosed alcohol pads. Let dry.  Apply patch as indicated in monitor instructions. Patch will be placed under collarbone on left  side of chest with arrow pointing upward.  Rub patch adhesive wings for 2 minutes. Remove white label marked 1. Remove the white  label marked 2. Rub patch adhesive wings for 2 additional minutes.  While looking in a mirror, press and release button in center of patch. A small green light will  flash 3-4 times. This will be your only indicator that the monitor has been turned on.  Do not shower for the first 24 hours. You may shower after the first 24 hours.  Press the button if you feel a symptom. You will hear a small click. Record Date, Time and  Symptom in the Patient Logbook.  When you are ready to remove the patch, follow instructions on the last 2 pages of Patient  Logbook. Stick patch monitor onto the last page of Patient Logbook.   Place Patient Logbook in the blue and white box. Use locking tab on box and tape box closed  securely. The blue and white box has prepaid postage on it. Please place it in the mailbox as  soon as possible. Your physician should have your test results approximately 7 days after the  monitor has been mailed  back to [facility].   Call [facility] Customer Care at [phone] if you have questions regarding  your ZIO XT patch monitor. Call them immediately if you see an orange light blinking on your  monitor.   If your monitor falls off in less than 4 days, contact our Monitor department at [phone].   If your monitor becomes loose or falls off after 4 days call [employer] at [phone] for  suggestions on securing your monitor.

## 2024-09-21 NOTE — Progress Notes (Unsigned)
 Enrolled for [employer] to mail a ZIO XT long term holter monitor to the patients address on file.

## 2024-09-22 LAB — CBC

## 2024-09-23 LAB — IRON,TIBC AND FERRITIN PANEL
Ferritin: 7 ng/mL — AB (ref 15–150)
Iron Saturation: 10 % — AB (ref 15–55)
Iron: 45 ug/dL (ref 27–159)
Total Iron Binding Capacity: 459 ug/dL — AB (ref 250–450)
UIBC: 414 ug/dL (ref 131–425)

## 2024-09-23 LAB — COMPREHENSIVE METABOLIC PANEL WITH GFR
ALT: 25 IU/L (ref 0–32)
AST: 29 IU/L (ref 0–40)
Albumin: 4 g/dL (ref 4.0–5.0)
Alkaline Phosphatase: 40 IU/L — ABNORMAL LOW (ref 41–116)
BUN/Creatinine Ratio: 11 (ref 9–23)
BUN: 7 mg/dL (ref 6–20)
Bilirubin Total: 0.3 mg/dL (ref 0.0–1.2)
CO2: 21 mmol/L (ref 20–29)
Calcium: 9.1 mg/dL (ref 8.7–10.2)
Chloride: 102 mmol/L (ref 96–106)
Creatinine, Ser: 0.61 mg/dL (ref 0.57–1.00)
Globulin, Total: 2.8 g/dL (ref 1.5–4.5)
Glucose: 82 mg/dL (ref 70–99)
Potassium: 4.3 mmol/L (ref 3.5–5.2)
Sodium: 135 mmol/L (ref 134–144)
Total Protein: 6.8 g/dL (ref 6.0–8.5)
eGFR: 130 mL/min/1.73 (ref >59)

## 2024-09-23 LAB — TSH: TSH: 1.2 u[IU]/mL (ref 0.450–4.500)

## 2024-09-23 LAB — CBC
Hematocrit: 37.3 % (ref 34.0–46.6)
Hemoglobin: 11.1 g/dL (ref 11.1–15.9)
MCH: 23.6 pg — AB (ref 26.6–33.0)
MCHC: 29.8 g/dL — AB (ref 31.5–35.7)
MCV: 79 fL (ref 79–97)
Platelets: 297 x10E3/uL (ref 150–450)
RBC: 4.71 x10E6/uL (ref 3.77–5.28)
RDW: 15.1 % (ref 11.7–15.4)
WBC: 3.4 x10E3/uL (ref 3.4–10.8)

## 2024-09-23 LAB — MAGNESIUM: Magnesium: 1.9 mg/dL (ref 1.6–2.3)

## 2024-09-24 ENCOUNTER — Ambulatory Visit: Payer: Self-pay | Admitting: Cardiology

## 2024-09-24 DIAGNOSIS — D509 Iron deficiency anemia, unspecified: Secondary | ICD-10-CM

## 2024-10-07 ENCOUNTER — Telehealth: Payer: Self-pay

## 2024-10-07 NOTE — Telephone Encounter (Signed)
 Copied from CRM #[id number]. Topic: General - Other >> Oct 07, 2024 11:14 AM Kevelyn M wrote: Reason for CRM: Patient is requesting a copy of her immunizations. Patient was directed to Mychart and she did find the section but wants to know if we can sent a copy to her through Mychart.  Call back: [phone]   I returned call from patient, Immunizations printed and placed at front desk for pick up.

## 2024-10-07 NOTE — Telephone Encounter (Addendum)
 Patient picked up immuno. Records on 10/07/24 @ 12:35pm - NN

## 2024-10-13 ENCOUNTER — Encounter (HOSPITAL_COMMUNITY): Payer: Self-pay

## 2024-10-13 ENCOUNTER — Ambulatory Visit (INDEPENDENT_AMBULATORY_CARE_PROVIDER_SITE_OTHER): Admitting: Mental Health

## 2024-10-13 DIAGNOSIS — F411 Generalized anxiety disorder: Secondary | ICD-10-CM | POA: Diagnosis not present

## 2024-10-13 DIAGNOSIS — F410 Panic disorder [episodic paroxysmal anxiety] without agoraphobia: Secondary | ICD-10-CM | POA: Diagnosis not present

## 2024-10-14 ENCOUNTER — Ambulatory Visit
Admission: EM | Admit: 2024-10-14 | Discharge: 2024-10-14 | Disposition: A | Discharge status: Home / Self Care | Attending: Nurse Practitioner | Admitting: Nurse Practitioner

## 2024-10-14 DIAGNOSIS — J988 Other specified respiratory disorders: Secondary | ICD-10-CM | POA: Diagnosis not present

## 2024-10-14 DIAGNOSIS — B9789 Other viral agents as the cause of diseases classified elsewhere: Secondary | ICD-10-CM | POA: Diagnosis not present

## 2024-10-14 LAB — POC COVID19/FLU A&B COMBO
Covid Antigen, POC: NEGATIVE
Influenza A Antigen, POC: NEGATIVE
Influenza B Antigen, POC: NEGATIVE

## 2024-10-14 MED ORDER — PROMETHAZINE-DM 6.25-15 MG/5ML PO SYRP: 10.0000 mL | ORAL_SOLUTION | Freq: Four times a day (QID) | ORAL | 0 refills | Status: DC | PRN

## 2024-10-14 MED ORDER — ACETAMINOPHEN 325 MG PO TABS
650.0000 mg | ORAL_TABLET | Freq: Once | ORAL | Status: AC
  Administered 2024-10-14: 650 mg via ORAL

## 2024-10-14 MED ORDER — IBUPROFEN 800 MG PO TABS: 800.0000 mg | ORAL_TABLET | Freq: Three times a day (TID) | ORAL | 0 refills | Status: DC | PRN

## 2024-10-14 NOTE — ED Triage Notes (Signed)
 Pt c/o prod cough, fever, body aches started yesterday-no meds PTA-NAD-steady gait

## 2024-10-14 NOTE — Discharge Instructions (Addendum)
 Your symptoms are most likely due to a respiratory infection affecting your nose, throat, or lungs. Your COVID and FLU tests today was negative. Because your symptoms just started yesterday, the test may have been done too early, which means there is still a chance of a false negative result. It is recommended that you retest in two days. A home test is fine, or you may schedule a follow-up test with your primary care provider or return to the urgent care if you prefer. Please take any medications prescribed to you as directed. Several over-the-counter medicines can make you more comfortable. Decongestants such as Sudafed or nasal sprays like Flonase  may relieve nasal congestion, while saline sprays or rinses can be used often to keep your nose clear. Sore throat discomfort may improve with lozenges, menthol  or benzocaine  sprays, or warm saltwater gargles. Allergy medicines such as Benadryl , Claritin , or Zyrtec can reduce a runny nose or post-nasal drip. These medicines do not cure the virus but can help you feel better as your body recovers. It is important to drink plenty of fluids to stay hydrated and help thin mucus. Aim for urine that is pale yellow. Using a cool mist humidifier at home, inhaling steam several times a day, and avoiding cool or dry air may also ease congestion. Sleeping with your head elevated can reduce post-nasal drainage, and getting enough rest each night supports recovery. Remember to replace your toothbrush once you start feeling better. A cough may last for several weeks after a respiratory illness even when other symptoms resolve, as the airways remain irritated. As long as the cough gradually improves and no new concerning symptoms appear, this is part of normal healing.

## 2024-10-14 NOTE — ED Provider Notes (Signed)
 [facility]    CSN: [id number] Arrival date & time: 10/14/24  0903      History   Chief Complaint Chief Complaint  Patient presents with   Cough    HPI Brandi Church is a 23 y.o. female.   Discussed the use of AI scribe software for clinical note transcription with the patient, who gave verbal consent to proceed.   The patient presents with fever and associated symptoms that began yesterday and worsened last night. She reports having a current fever of 100F and experienced chills last night. She endorses having a cough but is unable to cough anything up. The patient reports runny nose, nasal congestion, sore throat, and sneezing. She also complains of headache. She denies wheezing, shortness of breath, nausea, vomiting, or diarrhea. The patient reports possibly being around someone who was sick. She had not taken any medications for her symptoms prior to arrival.   The following sections of the patient's history were reviewed and updated as appropriate: allergies, current medications, past family history, past medical history, past social history, past surgical history, and problem list.     Past Medical History:  Diagnosis Date   ADHD (attention deficit hyperactivity disorder)    Anemia    Asthma    Complication of anesthesia    Depression    GAD (generalized anxiety disorder)    Headache disorder 06/22/2020   History of RSV infection 10/20/2022   ED visit in epic positive test and dx viral tonsillitis   History of tonsillitis 11/24/2022   (12-25-2022 per pt symptoms completely resolved)  ED visit in epic, can't breath or swallow due to enlarged tonsill's , dx acute pharyngitis;   in epic per  PCP note 11-26-2022  dx tonsillitis w/ exudate , given prednisone  pack   MDD (major depressive disorder)    12/28/22   MDD (major depressive disorder), recurrent episode, moderate (HCC) 01/19/2021   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/26/2023   PONV  (postoperative nausea and vomiting)    Supervision of normal first pregnancy 08/16/2021              Nursing Staff    Provider      Office Location     [facility]    Dating     LMP      Language      ENGLISH    Anatomy US      [facility], resolved on follow-up      Flu Vaccine     08/14/2021    Genetic/Carrier Screen     NIPS: low risk  AFP:   Screen neg  Horizon: negative      TDaP Vaccine      12/20/2021    Hgb A1C or   GTT    Early   Third trimester [id number]      COVID Vaccine     YES         L   Unintended weight loss 10/08/2022    Patient Active Problem List   Diagnosis Date Noted   Other constipation 12/16/2023   Acid indigestion 12/16/2023   Iron  deficiency anemia 07/31/2023   Attention deficit hyperactivity disorder (ADHD), predominantly inattentive type 01/19/2021   Generalized anxiety disorder with panic attacks 12/09/2020   Asthma 05/26/2019    Past Surgical History:  Procedure Laterality Date   LABIOPLASTY N/A 12/31/2022   Procedure: LABIAPLASTY;  Surgeon: Henry Slough, MD;  Location: [facility];  Service: Gynecology;  Laterality: N/A;   NO PAST SURGERIES  TONSILLECTOMY     WISDOM TOOTH EXTRACTION     removed on 08/16/2022.  11/28/2021    OB History     Gravida  1   Para  1   Term  1   Preterm  0   AB  0   Living  1      SAB  0   IAB  0   Ectopic  0   Multiple  0   Live Births  1            Home Medications    Prior to Admission medications   Medication Sig Start Date End Date Taking? Authorizing Provider  ibuprofen  (ADVIL ) 800 MG tablet Take 1 tablet (800 mg total) by mouth every 8 (eight) hours as needed (pain). Do not take any additional over-the-counter NSAIDs (such as Advil , Aleve, or other ibuprofen /naproxen products) while using this medication. 10/14/24  Yes Iola Lukes, FNP  promethazine-dextromethorphan (PROMETHAZINE-DM) 6.25-15 MG/5ML syrup Take 10 mLs by mouth every 6 (six) hours as needed for cough. 10/14/24  Yes Iola Lukes,  FNP  escitalopram  (LEXAPRO ) 10 MG tablet TAKE 1 TABLET BY MOUTH EVERY DAY 07/13/24   Lucius Krabbe, NP  etonogestrel-ethinyl estradiol (NUVARING) 0.12-0.015 MG/24HR vaginal ring Place 1 each vaginally every 28 (twenty-eight) days. Insert vaginally and leave in place for 3 consecutive weeks, then remove for 1 week.    [provider]  lisdexamfetamine (VYVANSE ) 30 MG capsule Take 1 capsule (30 mg total) by mouth daily. 06/17/24   Lucius Krabbe, NP    Family History Family History  Problem Relation Age of Onset   Depression Mother    Asthma Mother    Depression Father    Asthma Father    Hyperlipidemia Maternal Grandmother    Heart disease Maternal Grandmother    Hypertension Maternal Grandmother    Hyperlipidemia Maternal Grandfather    Heart disease Maternal Grandfather    COPD Maternal Grandfather    Hypertension Maternal Grandfather    Diabetes Maternal Grandfather     Social History Social History   Tobacco Use   Smoking status: Never    Passive exposure: Never   Smokeless tobacco: Never  Vaping Use   Vaping status: Never Used  Substance Use Topics   Alcohol use: Yes    Comment: occ   Drug use: Never     Allergies   Latex   Review of Systems Review of Systems  Constitutional:  Positive for chills and fever.  HENT:  Positive for congestion and rhinorrhea. Negative for sneezing and sore throat.   Respiratory:  Positive for cough. Negative for shortness of breath and wheezing.   Gastrointestinal:  Positive for nausea. Negative for diarrhea and vomiting.  Musculoskeletal:  Positive for myalgias.  Neurological:  Positive for headaches.  All other systems reviewed and are negative.    Physical Exam Triage Vital Signs ED Triage Vitals  Encounter Vitals Group     BP 10/14/24 0939 112/75     Girls Systolic BP Percentile --      Girls Diastolic BP Percentile --      Boys Systolic BP Percentile --      Boys Diastolic BP Percentile --      Pulse  Rate 10/14/24 0939 (!) 114     Resp 10/14/24 0939 16     Temp 10/14/24 0939 100 F (37.8 C)     Temp Source 10/14/24 0939 Oral     SpO2 10/14/24 0939 97 %  Weight --      Height --      Head Circumference --      Peak Flow --      Pain Score 10/14/24 0937 7     Pain Loc --      Pain Education --      Exclude from Growth Chart --    No data found.  Updated Vital Signs BP 112/75 (BP Location: Left Arm)   Pulse (!) 114   Temp 100 F (37.8 C) (Oral)   Resp 16   LMP 09/07/2024   SpO2 97%   Visual Acuity Right Eye Distance:   Left Eye Distance:   Bilateral Distance:    Right Eye Near:   Left Eye Near:    Bilateral Near:     Physical Exam Vitals reviewed.  Constitutional:      General: She is awake. She is not in acute distress.    Appearance: Normal appearance. She is well-developed. She is not ill-appearing, toxic-appearing or diaphoretic.  HENT:     Head: Normocephalic.     Right Ear: Tympanic membrane, ear canal and external ear normal. No drainage, swelling or tenderness. No middle ear effusion. Tympanic membrane is not erythematous.     Left Ear: Tympanic membrane, ear canal and external ear normal. No drainage, swelling or tenderness.  No middle ear effusion. Tympanic membrane is not erythematous.     Nose: Congestion present.     Mouth/Throat:     Lips: Pink.     Mouth: Mucous membranes are moist.     Pharynx: No pharyngeal swelling, oropharyngeal exudate, posterior oropharyngeal erythema or uvula swelling.     Tonsils: No tonsillar exudate or tonsillar abscesses.  Eyes:     General: Vision grossly intact.     Conjunctiva/sclera: Conjunctivae normal.  Cardiovascular:     Rate and Rhythm: Normal rate.     Heart sounds: Normal heart sounds.  Pulmonary:     Effort: Pulmonary effort is normal. No tachypnea or respiratory distress.     Breath sounds: Normal breath sounds and air entry.  Musculoskeletal:        General: Normal range of motion.     Cervical  back: Normal range of motion and neck supple.  Lymphadenopathy:     Cervical: No cervical adenopathy.  Skin:    General: Skin is warm and dry.  Neurological:     General: No focal deficit present.     Mental Status: She is alert and oriented to person, place, and time.  Psychiatric:        Behavior: Behavior is cooperative.      UC Treatments / Results  Labs (all labs ordered are listed, but only abnormal results are displayed) Labs Reviewed  POC COVID19/FLU A&B COMBO - Normal    EKG   Radiology No results found.  Procedures Procedures (including critical care time)  Medications Ordered in UC Medications  acetaminophen  (TYLENOL ) tablet 650 mg (650 mg Oral Given 10/14/24 0942)    Initial Impression / Assessment and Plan / UC Course  I have reviewed the triage vital signs and the nursing notes.  Pertinent labs & imaging results that were available during my care of the patient were reviewed by me and considered in my medical decision making (see chart for details).     The patient presents with symptoms consistent with a viral upper respiratory infection. Exam is reassuring and no evidence of bacterial infection or acute cardiopulmonary process is noted. COVID and  FLU test today was negative; however, this may represent a false negative as symptoms began only one day ago and testing may have been performed too early. Supportive care is recommended. Instructions were given to seek emergency care if symptoms worsen, including shortness of breath, chest pain, persistent high fever, inability to tolerate fluids, or confusion.  Today's evaluation has revealed no signs of a dangerous process. Discussed diagnosis with patient and/or guardian. Patient and/or guardian aware of their diagnosis, possible red flag symptoms to watch out for and need for close follow up. Patient and/or guardian understands verbal and written discharge instructions. Patient and/or guardian comfortable with  plan and disposition.  Patient and/or guardian has a clear mental status at this time, good insight into illness (after discussion and teaching) and has clear judgment to make decisions regarding their care  Documentation was completed with the aid of voice recognition software. Transcription may contain typographical errors.   Final Clinical Impressions(s) / UC Diagnoses   Final diagnoses:  Viral respiratory illness     Discharge Instructions      Your symptoms are most likely due to a respiratory infection affecting your nose, throat, or lungs. Your COVID and FLU tests today was negative. Because your symptoms just started yesterday, the test may have been done too early, which means there is still a chance of a false negative result. It is recommended that you retest in two days. A home test is fine, or you may schedule a follow-up test with your primary care provider or return to the urgent care if you prefer. Please take any medications prescribed to you as directed. Several over-the-counter medicines can make you more comfortable. Decongestants such as Sudafed or nasal sprays like Flonase  may relieve nasal congestion, while saline sprays or rinses can be used often to keep your nose clear. Sore throat discomfort may improve with lozenges, menthol  or benzocaine  sprays, or warm saltwater gargles. Allergy medicines such as Benadryl , Claritin , or Zyrtec can reduce a runny nose or post-nasal drip. These medicines do not cure the virus but can help you feel better as your body recovers. It is important to drink plenty of fluids to stay hydrated and help thin mucus. Aim for urine that is pale yellow. Using a cool mist humidifier at home, inhaling steam several times a day, and avoiding cool or dry air may also ease congestion. Sleeping with your head elevated can reduce post-nasal drainage, and getting enough rest each night supports recovery. Remember to replace your toothbrush once you start feeling  better. A cough may last for several weeks after a respiratory illness even when other symptoms resolve, as the airways remain irritated. As long as the cough gradually improves and no new concerning symptoms appear, this is part of normal healing.          ED Prescriptions     Medication Sig Dispense Auth. Provider   promethazine-dextromethorphan (PROMETHAZINE-DM) 6.25-15 MG/5ML syrup Take 10 mLs by mouth every 6 (six) hours as needed for cough. 118 mL Rosaline Ezekiel, FNP   ibuprofen  (ADVIL ) 800 MG tablet Take 1 tablet (800 mg total) by mouth every 8 (eight) hours as needed (pain). Do not take any additional over-the-counter NSAIDs (such as Advil , Aleve, or other ibuprofen /naproxen products) while using this medication. 21 tablet Iola Lukes, FNP      PDMP not reviewed this encounter.   Iola Lukes, [place] 10/14/24 1201

## 2024-10-16 ENCOUNTER — Telehealth: Payer: Self-pay

## 2024-10-16 ENCOUNTER — Emergency Department (HOSPITAL_BASED_OUTPATIENT_CLINIC_OR_DEPARTMENT_OTHER)
Admission: EM | Admit: 2024-10-16 | Discharge: 2024-10-16 | Disposition: A | Discharge status: Home / Self Care | Source: Ambulatory Visit | Attending: Emergency Medicine | Admitting: Emergency Medicine

## 2024-10-16 ENCOUNTER — Encounter (HOSPITAL_BASED_OUTPATIENT_CLINIC_OR_DEPARTMENT_OTHER): Payer: Self-pay | Admitting: Emergency Medicine

## 2024-10-16 ENCOUNTER — Ambulatory Visit: Payer: Self-pay

## 2024-10-16 ENCOUNTER — Other Ambulatory Visit: Payer: Self-pay

## 2024-10-16 ENCOUNTER — Emergency Department (HOSPITAL_BASED_OUTPATIENT_CLINIC_OR_DEPARTMENT_OTHER)

## 2024-10-16 DIAGNOSIS — J111 Influenza due to unidentified influenza virus with other respiratory manifestations: Secondary | ICD-10-CM | POA: Diagnosis not present

## 2024-10-16 DIAGNOSIS — Z91040 Latex allergy status: Secondary | ICD-10-CM | POA: Diagnosis not present

## 2024-10-16 DIAGNOSIS — R112 Nausea with vomiting, unspecified: Secondary | ICD-10-CM

## 2024-10-16 DIAGNOSIS — J101 Influenza due to other identified influenza virus with other respiratory manifestations: Secondary | ICD-10-CM | POA: Diagnosis not present

## 2024-10-16 DIAGNOSIS — R059 Cough, unspecified: Secondary | ICD-10-CM | POA: Diagnosis not present

## 2024-10-16 MED ORDER — ONDANSETRON HCL 4 MG/2ML IJ SOLN
4.0000 mg | Freq: Once | INTRAMUSCULAR | Status: AC
  Administered 2024-10-16: 4 mg via INTRAVENOUS
  Filled 2024-10-16: qty 2

## 2024-10-16 MED ORDER — METOCLOPRAMIDE HCL 5 MG/ML IJ SOLN
5.0000 mg | Freq: Once | INTRAMUSCULAR | Status: AC
  Administered 2024-10-16: 5 mg via INTRAVENOUS
  Filled 2024-10-16: qty 2

## 2024-10-16 MED ORDER — METOCLOPRAMIDE HCL 10 MG PO TABS: 10.0000 mg | ORAL_TABLET | Freq: Four times a day (QID) | ORAL | 0 refills | Status: DC

## 2024-10-16 MED ORDER — KETOROLAC TROMETHAMINE 30 MG/ML IJ SOLN
30.0000 mg | Freq: Once | INTRAMUSCULAR | Status: AC
  Administered 2024-10-16: 30 mg via INTRAVENOUS
  Filled 2024-10-16: qty 1

## 2024-10-16 MED ORDER — SODIUM CHLORIDE 0.9 % IV BOLUS
1000.0000 mL | Freq: Once | INTRAVENOUS | Status: AC
  Administered 2024-10-16: 1000 mL via INTRAVENOUS

## 2024-10-16 NOTE — ED Triage Notes (Signed)
 Pt caox4, ambulatory reporting Flu A+ yest at UC, went to UC for flu-like symptoms x2 days, fever, bodyaches, productive cough but came to ED today because she started having N/V last night and increased SOB today with minimal relief from inhaler.

## 2024-10-16 NOTE — Telephone Encounter (Signed)
 Copied from CRM [id number]. Topic: Clinical - Medication Question >> Oct 15, 2024  4:56 PM Delon DASEN wrote: Reason for CRM: Patient has Flu A and is requesting prescription for xofluca- [phone]   Pt advised to go to ED.

## 2024-10-16 NOTE — Telephone Encounter (Signed)
 FYI Only or Action Required?: FYI only for provider: ED advised.  Patient was last seen in primary care on 06/17/2024 by Lucius Krabbe, NP.  Called Nurse Triage reporting Cough.  Symptoms began several days ago.  Interventions attempted: Rest, hydration, or home remedies.  Symptoms are: gradually worsening.  Triage Disposition: Go to ED Now (or PCP Triage)  Patient/caregiver understands and will follow disposition?: Yes  Copied from CRM #[id number]. Topic: Clinical - Red Word Triage >> Oct 16, 2024  9:03 AM Brandi Church wrote: [facility] that prompted transfer to Nurse Triage: Patient states she has the flu, took a home test positive for flu A. Symptoms include a productive cough, no voice, fever of 101, and vomiting, has been unable to keep anything down. Reason for Disposition  [1] SEVERE vomiting (e.g., 6 or more times/day) AND [2] present > 8 hours (Exception: Patient sounds well, is drinking liquids, does not sound dehydrated, and vomiting has lasted less than 24 hours.)  [1] Drinking very little AND [2] dehydration suspected (e.g., no urine > 12 hours, very dry mouth, very lightheaded)  Patient sounds very sick or weak to the triager  Answer Assessment - Initial Assessment Questions Pt states has had cough- thick green mucus, runny nose, fever that started Wednesday. 2am pt began vomiting. Pt then stated that she hasn't had any urine output since 3pm yesterday. Rn did not continue asking triage questions. Due to no urine output for over 16 hours, RN advised pt to go to the ER to be evaluated for possible dehydration. RN did inform patient not to drive due to possible dizziness. Pt stated understanding.      1. VOMITING SEVERITY: How many times have you vomited in the past 24 hours?      4 since 2am, all bile now 2. ONSET: When did the vomiting begin?      2am today 3. FLUIDS: What fluids or food have you vomited up today? Have you been able to keep any fluids down?      no  4. HYDRATION STATUS: Any signs of dehydration? (e.g., dry mouth [not only dry lips], too weak to stand) When did you last urinate?     3pm yesterday  Answer Assessment - Initial Assessment Questions RN did not complete all questions due to advising patient to go to ER after hearing no urine output since 3pm yesterday.     1. ONSET: When did the cough begin?      Wednesday  2. SPUTUM: Describe the color of your sputum (e.g., none, dry cough; clear, white, yellow, green)     Thick green   3 FEVER: Do you have a fever? If Yes, ask: What is your temperature, how was it measured, and when did it start?     101  4. OTHER SYMPTOMS: Do you have any other symptoms? (e.g., runny nose, wheezing, chest pain)       Runny nose, fever  Protocols used: Cough - Acute Productive-A-AH, Vomiting-A-AH

## 2024-10-16 NOTE — ED Provider Notes (Signed)
°   EMERGENCY DEPARTMENT AT [facility] Provider Note   CSN: [id number] Arrival date & time: 10/16/24  1037     Patient presents with: Influenza   Brandi Church is a 23 y.o. female who presents emergency department with a chief complaint of nausea vomiting.  She was diagnosed with the flu 2 days ago.  She is unable hold anything down at home.  She complains of progressively worsening painful productive cough that is making her feel short of breath but does not have any wheezing.  She has been running fevers.  She denies abdominal pain.    Influenza      Prior to Admission medications  Medication Sig Start Date End Date Taking? Authorizing Provider  escitalopram  (LEXAPRO ) 10 MG tablet TAKE 1 TABLET BY MOUTH EVERY DAY 07/13/24   Lucius Krabbe, NP  etonogestrel-ethinyl estradiol (NUVARING) 0.12-0.015 MG/24HR vaginal ring Place 1 each vaginally every 28 (twenty-eight) days. Insert vaginally and leave in place for 3 consecutive weeks, then remove for 1 week.    [provider]  ibuprofen  (ADVIL ) 800 MG tablet Take 1 tablet (800 mg total) by mouth every 8 (eight) hours as needed (pain). Do not take any additional over-the-counter NSAIDs (such as Advil , Aleve, or other ibuprofen /naproxen products) while using this medication. 10/14/24   Murrill, Samantha, FNP  lisdexamfetamine  (VYVANSE ) 30 MG capsule Take 1 capsule (30 mg total) by mouth daily. 06/17/24   Lucius Krabbe, NP  promethazine -dextromethorphan (PROMETHAZINE -DM) 6.25-15 MG/5ML syrup Take 10 mLs by mouth every 6 (six) hours as needed for cough. 10/14/24   Iola Lukes, FNP    Allergies: Latex    Review of Systems  Updated Vital Signs BP 129/89   Pulse (!) 130   Temp 98 F (36.7 C) (Oral)   Resp 20   Ht 5' 5 (1.651 m)   Wt 61.2 kg   LMP 09/07/2024 (Approximate)   SpO2 100%   BMI 22.47 kg/m   Physical Exam  (all labs ordered are listed, but only abnormal results are displayed) Labs  Reviewed - No data to display  EKG: None  Radiology: No results found.   Procedures   Medications Ordered in the ED  sodium chloride  0.9 % bolus 1,000 mL (has no administration in time range)  ondansetron  (ZOFRAN ) injection 4 mg (has no administration in time range)    Clinical Course as of 10/16/24 1347  Fri Oct 16, 2024  1326 Visualized and interpreted chest x-ray shows no acute findings [AH]    Clinical Course User Index [AH] Mikel Hardgrove, PA-C                                 Medical Decision Making Amount and/or Complexity of Data Reviewed Radiology: ordered.  Risk Prescription drug management.   Patient with vomiting in the setting of diagnosis of influenza.  She has no abdominal pain.  Patient treated with fluids, antiemetics, no active vomiting in the ER, Toradol  for mild headache.  She is feeling greatly improved.  Appears appropriate for discharge at this time.  Will discharge with oral Reglan  for her nausea and vomiting.     Final diagnoses:  None    ED Discharge Orders     None          Arloa Chroman, PA-C 10/16/24 1616    Dean Clarity, MD 10/19/24 [phone]

## 2024-10-16 NOTE — Discharge Instructions (Addendum)
Continue frequent small sips (10-20 ml) of clear liquids every 5-10 minutes. Gatorade or powerade are good options. Avoid milk, orange juice, and grape juice for now. . Once you have not had further vomiting with the small sips for 4 hours, you may begin to drink larger volumes of fluids at a time and try a bland diet which may include saltine crackers, applesauce, breads, pastas, bananas, bland chicken. If you continues to vomit despite medication, return to the ED for repeat evaluation. Otherwise, follow up with your doctor in 2-3 days for a re-check.  

## 2024-10-16 NOTE — Telephone Encounter (Signed)
 Please see pt triage note, pt advised to go to ED for evaluation

## 2024-10-17 ENCOUNTER — Encounter: Payer: Self-pay | Admitting: Family

## 2024-10-17 ENCOUNTER — Telehealth (HOSPITAL_BASED_OUTPATIENT_CLINIC_OR_DEPARTMENT_OTHER): Payer: Self-pay | Admitting: Emergency Medicine

## 2024-10-17 MED ORDER — ONDANSETRON 4 MG PO TBDP: ORAL_TABLET | ORAL | 0 refills | Status: DC

## 2024-10-17 NOTE — Telephone Encounter (Signed)
 Patient is worried she has an intolerance to Reglan  and would like Zofran  sent to the pharmacy in its stead.
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
## 2024-10-19 ENCOUNTER — Other Ambulatory Visit: Payer: Self-pay

## 2024-10-19 DIAGNOSIS — R110 Nausea: Secondary | ICD-10-CM

## 2024-10-19 MED ORDER — ONDANSETRON 4 MG PO TBDP: ORAL_TABLET | ORAL | 0 refills | Status: AC

## 2024-10-19 NOTE — Telephone Encounter (Signed)
 ok to refill the zofran  on file for her, no refills, thx

## 2024-10-27 DIAGNOSIS — R002 Palpitations: Secondary | ICD-10-CM | POA: Diagnosis not present

## 2024-11-09 ENCOUNTER — Ambulatory Visit (HOSPITAL_COMMUNITY): Admitting: Clinical

## 2024-11-09 ENCOUNTER — Encounter (HOSPITAL_COMMUNITY): Payer: Self-pay

## 2024-11-11 ENCOUNTER — Telehealth (HOSPITAL_COMMUNITY): Payer: Self-pay | Admitting: *Deleted

## 2024-11-11 NOTE — Telephone Encounter (Signed)
 Reminder call with instructions given for upcoming GXT on 11/16/24 at 2:30

## 2024-11-12 ENCOUNTER — Telehealth: Payer: Self-pay | Admitting: Oncology

## 2024-11-12 ENCOUNTER — Inpatient Hospital Stay: Admitting: Oncology

## 2024-11-12 ENCOUNTER — Inpatient Hospital Stay

## 2024-11-12 ENCOUNTER — Telehealth: Payer: Self-pay

## 2024-11-12 NOTE — Telephone Encounter (Signed)
 I attempted to reach patient and voicemail box is full. I mailed an appointment reminder to address on file.

## 2024-11-12 NOTE — Telephone Encounter (Signed)
 Followed up with patient regarding her missed New Patient Hematology Appointment today wit Dr. Autumn at 1000. Left a voicemail informing that  a scheduler would reach out to reschedule.

## 2024-11-16 ENCOUNTER — Ambulatory Visit (HOSPITAL_COMMUNITY)
Admission: RE | Admit: 2024-11-16 | Discharge: 2024-11-16 | Disposition: A | Discharge status: Home / Self Care | Source: Ambulatory Visit | Attending: Internal Medicine | Admitting: Internal Medicine

## 2024-11-16 ENCOUNTER — Ambulatory Visit (HOSPITAL_COMMUNITY)
Admission: RE | Admit: 2024-11-16 | Discharge: 2024-11-16 | Disposition: A | Discharge status: Home / Self Care | Source: Ambulatory Visit | Attending: Cardiology | Admitting: Cardiology

## 2024-11-16 DIAGNOSIS — R079 Chest pain, unspecified: Secondary | ICD-10-CM | POA: Diagnosis not present

## 2024-11-16 LAB — EXERCISE TOLERANCE TEST
Angina Index: 1
Duke Treadmill Score: 2
Estimated workload: 7.3
Exercise duration (min): 6 min
Exercise duration (sec): 14 s
MPHR: 199 {beats}/min
Peak HR: 160 {beats}/min
Percent HR: 80 %
Rest HR: 77 {beats}/min
ST Depression (mm): 0 mm

## 2024-11-16 LAB — ECHOCARDIOGRAM COMPLETE
Area-P 1/2: 4.36 cm2
S' Lateral: 2.8 cm

## 2024-11-25 ENCOUNTER — Other Ambulatory Visit: Payer: Self-pay | Admitting: Oncology

## 2024-11-25 ENCOUNTER — Inpatient Hospital Stay

## 2024-11-25 ENCOUNTER — Inpatient Hospital Stay: Attending: Oncology | Admitting: Oncology

## 2024-11-25 VITALS — BP 125/76 | HR 104 | Temp 97.5°F | Resp 18 | Ht 65.0 in | Wt 138.9 lb

## 2024-11-25 DIAGNOSIS — R5383 Other fatigue: Secondary | ICD-10-CM | POA: Diagnosis not present

## 2024-11-25 DIAGNOSIS — R0600 Dyspnea, unspecified: Secondary | ICD-10-CM | POA: Insufficient documentation

## 2024-11-25 DIAGNOSIS — R002 Palpitations: Secondary | ICD-10-CM | POA: Insufficient documentation

## 2024-11-25 DIAGNOSIS — D500 Iron deficiency anemia secondary to blood loss (chronic): Secondary | ICD-10-CM

## 2024-11-25 DIAGNOSIS — Z800 Family history of malignant neoplasm of digestive organs: Secondary | ICD-10-CM | POA: Insufficient documentation

## 2024-11-25 DIAGNOSIS — N920 Excessive and frequent menstruation with regular cycle: Secondary | ICD-10-CM | POA: Diagnosis not present

## 2024-11-25 LAB — IRON AND IRON BINDING CAPACITY (CC-WL,HP ONLY)
Iron: 80 ug/dL (ref 28–170)
Saturation Ratios: 15 % (ref 10.4–31.8)
TIBC: 539 ug/dL — ABNORMAL HIGH (ref 250–450)
UIBC: 459 ug/dL

## 2024-11-25 LAB — CBC WITH DIFFERENTIAL (CANCER CENTER ONLY)
Abs Immature Granulocytes: 0 K/uL (ref 0.00–0.07)
Basophils Absolute: 0 K/uL (ref 0.0–0.1)
Basophils Relative: 1 %
Eosinophils Absolute: 0.1 K/uL (ref 0.0–0.5)
Eosinophils Relative: 2 %
HCT: 38.2 % (ref 36.0–46.0)
Hemoglobin: 12.2 g/dL (ref 12.0–15.0)
Immature Granulocytes: 0 %
Lymphocytes Relative: 51 %
Lymphs Abs: 3.2 K/uL (ref 0.7–4.0)
MCH: 24.4 pg — ABNORMAL LOW (ref 26.0–34.0)
MCHC: 31.9 g/dL (ref 30.0–36.0)
MCV: 76.2 fL — ABNORMAL LOW (ref 80.0–100.0)
Monocytes Absolute: 0.5 K/uL (ref 0.1–1.0)
Monocytes Relative: 8 %
Neutro Abs: 2.3 K/uL (ref 1.7–7.7)
Neutrophils Relative %: 38 %
Platelet Count: 322 K/uL (ref 150–400)
RBC: 5.01 MIL/uL (ref 3.87–5.11)
RDW: 14.6 % (ref 11.5–15.5)
WBC Count: 6 K/uL (ref 4.0–10.5)
nRBC: 0 % (ref 0.0–0.2)

## 2024-11-25 LAB — FOLATE: Folate: 11.4 ng/mL

## 2024-11-25 LAB — CMP (CANCER CENTER ONLY)
ALT: 28 U/L (ref 0–44)
AST: 30 U/L (ref 15–41)
Albumin: 4.5 g/dL (ref 3.5–5.0)
Alkaline Phosphatase: 50 U/L (ref 38–126)
Anion gap: 13 (ref 5–15)
BUN: 8 mg/dL (ref 6–20)
CO2: 23 mmol/L (ref 22–32)
Calcium: 9.5 mg/dL (ref 8.9–10.3)
Chloride: 103 mmol/L (ref 98–111)
Creatinine: 0.82 mg/dL (ref 0.44–1.00)
GFR, Estimated: >60 mL/min
Glucose, Bld: 76 mg/dL (ref 70–99)
Potassium: 4 mmol/L (ref 3.5–5.1)
Sodium: 138 mmol/L (ref 135–145)
Total Bilirubin: 0.3 mg/dL (ref 0.0–1.2)
Total Protein: 7.9 g/dL (ref 6.5–8.1)

## 2024-11-25 LAB — VITAMIN B12: Vitamin B-12: 837 pg/mL (ref 180–914)

## 2024-11-25 LAB — FERRITIN: Ferritin: 12 ng/mL (ref 11–307)

## 2024-11-25 NOTE — Progress Notes (Signed)
 "  [facility]  HEMATOLOGY CLINIC CONSULTATION NOTE   PATIENT NAMEShandra Church   MR#: [id number] DOB: May 25, 2001  DATE OF SERVICE: 11/25/2024  Patient Care Team: Lucius Krabbe, NP as PCP - General (Family Medicine)  REASON FOR CONSULTATION/ CHIEF COMPLAINT:  Evaluation of anemia.  ASSESSMENT & PLAN:   Brandi Church is a 24 y.o. lady with a past medical history of ADHD, depression/anxiety disorder, asthma, was referred to our service for evaluation of iron  deficiency anemia.    Iron  deficiency anemia She was referred to cardiology for evaluation of palpitations.  On 09/22/2024, labs at cardiologist office showed hemoglobin of 11.1, MCV 79, white count 3400, platelet count 297,000.  Iron  studies showed evidence of iron  deficiency with ferritin of 7, iron  saturation of 10%.  Referral was sent to us  for further evaluation and management of iron  deficiency anemia.  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 She reports persistent fatigue, palpitations, and worsening exertional dyspnea. Hemoglobin and leukocyte count are now within normal limits; iron  saturation is at the lower limit of normal, and ferritin is low at 12. She has intolerance to prescription oral iron  but better tolerance of over-the-counter liquid iron .  Hemoglobin today is better at 12.2.  MCV remains low at 76.2.  Ferritin today is 12, decreased.  Iron  binding capacity is increased at 539, indicating persistent iron  deficiency state.  Given her symptoms from iron  deficiency, we will proceed with IV iron  infusion with either Feraheme or Venofer , pending authorization.  Will arrange infusions at infusion center on [address]. - Reviewed recent CBC and iron  studies; hemoglobin and white count normal, iron  saturation at lower limit of normal, ferritin pending.  - Educated her regarding the association between iron   deficiency and symptoms including fatigue, pica, and dyspnea.  - She was also recommended a trial of over-the-counter oral iron  alternatives (e.g., MegaFood Blood Builder, Slow FE) with vitamin C to enhance absorption, as these are better tolerated than prescription iron .  RTC in 3 months for follow-up with repeat labs.    I reviewed lab results and outside records for this visit and discussed relevant results with the patient. Diagnosis, plan of care and treatment options were also discussed in detail with the patient. Opportunity provided to ask questions and answers provided to her apparent satisfaction. Provided instructions to call our clinic with any problems, questions or concerns prior to return visit. I recommended to continue follow-up with PCP and sub-specialists. She verbalized understanding and agreed with the plan. No barriers to learning was detected.  Brandi Sneed, MD [facility] - A DEPT OF [facility] [address] [place] [address] Dept: [phone] Dept Fax: [phone]  11/25/2024 1:35 PM  HISTORY OF PRESENT ILLNESS:  Discussed the use of AI scribe software for clinical note transcription with the patient, who gave verbal consent to proceed.  History of Present Illness Brandi Church is a 24 year old female with chronic iron  deficiency anemia who presents for evaluation of persistent fatigue and assessment of iron  status.  She was referred to cardiology for evaluation of palpitations.  On 09/22/2024, labs at cardiologist office showed hemoglobin of 11.1, MCV 79, white count 3400, platelet count 297,000.  Iron  studies showed evidence of iron  deficiency with ferritin of 7, iron  saturation of 10%.  Referral was sent to us  for further evaluation and management of iron  deficiency anemia.  She has experienced persistent fatigue, palpitations, intermittent chest pain, and worsening dyspnea, particularly  nocturnally, with exertion such as climbing stairs, and occasionally with prolonged talking. Palpitations and heart rate irregularities have been present for several years, persisting despite a normal echocardiogram in November 2025. She notes cravings for ice chips and orange starch. There is no history of recent blood donation or other significant sources of blood loss aside from prior menorrhagia and occasional gingival bleeding.  Her iron  deficiency was first identified during pregnancy, requiring intravenous iron  infusions during gestation and for four months postpartum. Approximately 1.5 years ago, she initiated continuous NuvaRing therapy to suppress menses, resulting in cessation of regular cycles and only brief episodes of breakthrough bleeding. Oral iron  supplementation was limited by gastrointestinal side effects, including constipation and dyspepsia, though she tolerated liquid iron  (3S tonic) with only mild symptoms. She is currently not taking dedicated iron  supplements, only a multivitamin containing a small amount of iron . Chronic constipation is exacerbated by oral iron  therapy.  Recent laboratory data from November 2025 demonstrated hemoglobin of 11.1 g/dL and leukopenia (WBC 6,599). Historical hemoglobin values have ranged from 8.8 g/dL (7976) to 89-89.5 g/dL (7975). She has not had recent laboratory evaluation until today; ferritin results are pending. She underwent a Pap smear last week.  There is a significant family history of early-onset colon cancer, with her father diagnosed at age 65 and her paternal grandmother also affected. She is unsure if her father underwent genetic testing.     MEDICAL HISTORY:  Past Medical History:  Diagnosis Date   ADHD (attention deficit hyperactivity disorder)    Anemia    Asthma    Complication of anesthesia    Depression    GAD (generalized anxiety disorder)    Headache disorder 06/22/2020   History of RSV infection 10/20/2022   ED visit  in epic positive test and dx viral tonsillitis   History of tonsillitis 11/24/2022   (12-25-2022 per pt symptoms completely resolved)  ED visit in epic, can't breath or swallow due to enlarged tonsill's , dx acute pharyngitis;   in epic per  PCP note 11-26-2022  dx tonsillitis w/ exudate , given prednisone  pack   MDD (major depressive disorder)    12/28/22   MDD (major depressive disorder), recurrent episode, moderate (HCC) 01/19/2021   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/26/2023   PONV (postoperative nausea and vomiting)    Supervision of normal first pregnancy 08/16/2021              Nursing Staff    Provider      Office Location     [facility]    Dating     LMP      Language      ENGLISH    Anatomy US      [facility], resolved on follow-up      Flu Vaccine     08/14/2021    Genetic/Carrier Screen     NIPS: low risk  AFP:   Screen neg  Horizon: negative      TDaP Vaccine      12/20/2021    Hgb A1C or   GTT    Early   Third trimester [id number]      COVID Vaccine     YES         L   Unintended weight loss 10/08/2022    SURGICAL HISTORY: Past Surgical History:  Procedure Laterality Date   LABIOPLASTY N/A 12/31/2022   Procedure: LABIAPLASTY;  Surgeon: Henry Slough, MD;  Location: [facility] OR;  Service: Gynecology;  Laterality: N/A;   NO PAST SURGERIES     TONSILLECTOMY     WISDOM TOOTH EXTRACTION     removed on 08/16/2022.  11/28/2021    SOCIAL HISTORY: She reports that she has never smoked. She has never been exposed to tobacco smoke. She has never used smokeless tobacco. She reports current alcohol use. She reports that she does not use drugs. Social History   Socioeconomic History   Marital status: Single    Spouse name: Not on file   Number of children: Not on file   Years of education: Not on file   Highest education level: Some college, no degree  Occupational History   Not on file  Tobacco Use   Smoking status: Never    Passive exposure: Never   Smokeless tobacco: Never  Vaping  Use   Vaping status: Never Used  Substance and Sexual Activity   Alcohol use: Yes    Comment: occ   Drug use: Never   Sexual activity: Not Currently    Birth control/protection: Inserts    Comment: nuvaring  Other Topics Concern   Not on file  Social History Narrative   Not on file   Social Drivers of Health   Tobacco Use: Low Risk (10/16/2024)   Patient History    Smoking Tobacco Use: Never    Smokeless Tobacco Use: Never    Passive Exposure: Never  Financial Resource Strain: Low Risk (10/02/2023)   Overall Financial Resource Strain (CARDIA)    Difficulty of Paying Living Expenses: Not hard at all  Food Insecurity: No Food Insecurity (10/02/2023)   Hunger Vital Sign    Worried About Running Out of Food in the Last Year: Never true    Ran Out of Food in the Last Year: Never true  Transportation Needs: No Transportation Needs (10/02/2023)   PRAPARE - Administrator, Civil Service (Medical): No    Lack of Transportation (Non-Medical): No  Physical Activity: Sufficiently Active (10/02/2023)   Exercise Vital Sign    Days of Exercise per Week: 5 days    Minutes of Exercise per Session: 30 min  Recent Concern: Physical Activity - Insufficiently Active (08/12/2023)   Exercise Vital Sign    Days of Exercise per Week: 3 days    Minutes of Exercise per Session: 30 min  Stress: Stress Concern Present (10/02/2023)   [employer] of Occupational Health - Occupational Stress Questionnaire    Feeling of Stress : To some extent  Social Connections: Moderately Isolated (10/02/2023)   Social Connection and Isolation Panel    Frequency of Communication with Friends and Family: More than three times a week    Frequency of Social Gatherings with Friends and Family: More than three times a week    Attends Religious Services: More than 4 times per year    Active Member of Clubs or Organizations: No    Attends Banker Meetings: Not on file    Marital Status:  Never married  Intimate Partner Violence: Not At Risk (10/13/2024)   Epic    Fear of Current or Ex-Partner: No    Emotionally Abused: No    Physically Abused: No    Sexually Abused: No  Depression (PHQ2-9): Low Risk (11/25/2024)   Depression (PHQ2-9)    PHQ-2 Score: 0  Recent Concern: Depression (PHQ2-9) - High Risk (10/13/2024)   Depression (PHQ2-9)    PHQ-2 Score: 11  Alcohol Screen: Low Risk (08/12/2023)  Alcohol Screen    Last Alcohol Screening Score (AUDIT): 0  Housing: Low Risk (10/02/2023)   Housing    Last Housing Risk Score: 0  Utilities: Not At Risk (10/13/2024)   Epic    Threatened with loss of utilities: No  Health Literacy: Adequate Health Literacy (10/13/2024)   B1300 Health Literacy    Frequency of need for help with medical instructions: Never    FAMILY HISTORY: Family History  Problem Relation Age of Onset   Depression Mother    Asthma Mother    Depression Father    Asthma Father    Hyperlipidemia Maternal Grandmother    Heart disease Maternal Grandmother    Hypertension Maternal Grandmother    Hyperlipidemia Maternal Grandfather    Heart disease Maternal Grandfather    COPD Maternal Grandfather    Hypertension Maternal Grandfather    Diabetes Maternal Grandfather     ALLERGIES:  She is allergic to latex.  MEDICATIONS:  Current Outpatient Medications  Medication Sig Dispense Refill   etonogestrel-ethinyl estradiol (NUVARING) 0.12-0.015 MG/24HR vaginal ring Place 1 each vaginally every 28 (twenty-eight) days. Insert vaginally and leave in place for 3 consecutive weeks, then remove for 1 week.     ondansetron  (ZOFRAN -ODT) 4 MG disintegrating tablet 4mg  ODT q4 hours prn nausea/vomit 20 tablet 0   No current facility-administered medications for this visit.    REVIEW OF SYSTEMS:    Review of Systems - Oncology  All other pertinent systems were reviewed and were negative except as mentioned above.  PHYSICAL EXAMINATION:   Onc Performance Status -  11/25/24 1321       ECOG Perf Status   ECOG Perf Status Restricted in physically strenuous activity but ambulatory and able to carry out work of a light or sedentary nature, e.g., light house work, office work      KPS SCALE   KPS % SCORE Normal activity with effort, some s/s of disease          Vitals:   11/25/24 1304  BP: 125/76  Pulse: (!) 104  Resp: 18  Temp: (!) 97.5 F (36.4 C)  SpO2: 100%   Filed Weights   11/25/24 1304  Weight: 138 lb 14.4 oz (63 kg)    Physical Exam Constitutional:      General: She is not in acute distress.    Appearance: Normal appearance.  HENT:     Head: Normocephalic and atraumatic.  Cardiovascular:     Rate and Rhythm: Normal rate.     Heart sounds: Normal heart sounds.  Pulmonary:     Effort: Pulmonary effort is normal. No respiratory distress.     Breath sounds: Normal breath sounds.  Abdominal:     General: There is no distension.  Neurological:     General: No focal deficit present.     Mental Status: She is alert and oriented to person, place, and time.  Psychiatric:        Mood and Affect: Mood normal.        Behavior: Behavior normal.      LABORATORY DATA:   I have reviewed the data as listed.  Results for orders placed or performed in visit on 11/25/24  Folate  Result Value Ref Range   Folate 11.4 >5.9 ng/mL  Vitamin B12  Result Value Ref Range   Vitamin B-12 837 180 - 914 pg/mL  Ferritin  Result Value Ref Range   Ferritin 12 11 - 307 ng/mL  Iron  and Iron  Binding Capacity ([facility]  only)  Result Value Ref Range   Iron  80 28 - 170 ug/dL   TIBC 460 (H) 749 - 549 ug/dL   Saturation Ratios 15 10.4 - 31.8 %   UIBC 459 ug/dL  CMP (Cancer Center only)  Result Value Ref Range   Sodium 138 135 - 145 mmol/L   Potassium 4.0 3.5 - 5.1 mmol/L   Chloride 103 98 - 111 mmol/L   CO2 23 22 - 32 mmol/L   Glucose, Bld 76 70 - 99 mg/dL   BUN 8 6 - 20 mg/dL   Creatinine 9.17 9.55 - 1.00 mg/dL   Calcium 9.5 8.9 - 89.6  mg/dL   Total Protein 7.9 6.5 - 8.1 g/dL   Albumin 4.5 3.5 - 5.0 g/dL   AST 30 15 - 41 U/L   ALT 28 0 - 44 U/L   Alkaline Phosphatase 50 38 - 126 U/L   Total Bilirubin 0.3 0.0 - 1.2 mg/dL   GFR, Estimated >39 >39 mL/min   Anion gap 13 5 - 15  CBC with Differential (Cancer Center Only)  Result Value Ref Range   WBC Count 6.0 4.0 - 10.5 K/uL   RBC 5.01 3.87 - 5.11 MIL/uL   Hemoglobin 12.2 12.0 - 15.0 g/dL   HCT 61.7 63.9 - 53.9 %   MCV 76.2 (L) 80.0 - 100.0 fL   MCH 24.4 (L) 26.0 - 34.0 pg   MCHC 31.9 30.0 - 36.0 g/dL   RDW 85.3 88.4 - 84.4 %   Platelet Count 322 150 - 400 K/uL   nRBC 0.0 0.0 - 0.2 %   Neutrophils Relative % 38 %   Neutro Abs 2.3 1.7 - 7.7 K/uL   Lymphocytes Relative 51 %   Lymphs Abs 3.2 0.7 - 4.0 K/uL   Monocytes Relative 8 %   Monocytes Absolute 0.5 0.1 - 1.0 K/uL   Eosinophils Relative 2 %   Eosinophils Absolute 0.1 0.0 - 0.5 K/uL   Basophils Relative 1 %   Basophils Absolute 0.0 0.0 - 0.1 K/uL   Immature Granulocytes 0 %   Abs Immature Granulocytes 0.00 0.00 - 0.07 K/uL    RADIOGRAPHIC STUDIES:  I have personally reviewed the radiological images as listed and agree with the findings in the report.  Exercise Tolerance Test Result Date: 11/16/2024   A Bruce protocol stress test was performed. Patient exercised for 6 min and 14 sec. Maximum HR of 160 bpm. MPHR 80.0%. Peak METS 7.3. The patient experienced non-limiting angina during the test. The test was stopped because the patient experienced fatigue and dyspnea. The patient reported dyspnea, fatigue and chest pain during the stress test. Normal blood pressure and normal heart rate response noted during stress.   No ST deviation was noted. There were no arrhythmias during stress. There were no arrhythmias during recovery. ECG was interpretable and conclusive. The ECG was negative for ischemia.   ECHOCARDIOGRAM COMPLETE Result Date: 11/16/2024    ECHOCARDIOGRAM REPORT   Patient Name:   Brandi Church  Date of  Exam: 11/16/2024 Medical Rec #:  [id number]     Height:       65.0 in Accession #:    [id number]    Weight:       135.0 lb Date of Birth:  [birth year]      BSA:          1.674 m Patient Age:    23 years      BP:  108/68 mmHg Patient Gender: F             HR:           68 bpm. Exam Location:  [address] Procedure: 2D Echo, 3D Echo, Cardiac Doppler, Color Doppler and Strain Analysis            (Both Spectral and Color Flow Doppler were utilized during            procedure). Indications:    R00.2 Palpitations                 R07.9 Chest pain  History:        Patient has no prior history of Echocardiogram examinations.  Sonographer:    Carl Rodgers-Jones RDCS Referring Phys: [id number] CHRISTOPHER L SCHUMANN IMPRESSIONS  1. Left ventricular ejection fraction, by estimation, is 60 to 65%. Left ventricular ejection fraction by 3D volume is 60 %. The left ventricle has normal function. The left ventricle has no regional wall motion abnormalities. Left ventricular diastolic  parameters were normal. The average left ventricular global longitudinal strain is -28.0 %. The global longitudinal strain is normal.  2. Right ventricular systolic function is normal. The right ventricular size is normal. There is normal pulmonary artery systolic pressure. The estimated right ventricular systolic pressure is 18.2 mmHg.  3. The mitral valve is grossly normal. Trivial mitral valve regurgitation.  4. The aortic valve is tricuspid. Aortic valve regurgitation is not visualized.  5. The inferior vena cava is normal in size with greater than 50% respiratory variability, suggesting right atrial pressure of 3 mmHg. Comparison(s): No prior Echocardiogram. Conclusion(s)/Recommendation(s): Normal biventricular function without evidence of hemodynamically significant valvular heart disease. FINDINGS  Left Ventricle: Left ventricular ejection fraction, by estimation, is 60 to 65%. Left ventricular ejection fraction by 3D volume is 60 %. The  left ventricle has normal function. The left ventricle has no regional wall motion abnormalities. The average left ventricular global longitudinal strain is -28.0 %. Strain was performed and the global longitudinal strain is normal. The left ventricular internal cavity size was normal in size. There is no left ventricular hypertrophy. Left ventricular diastolic parameters were normal. Right Ventricle: The right ventricular size is normal. No increase in right ventricular wall thickness. Right ventricular systolic function is normal. There is normal pulmonary artery systolic pressure. The tricuspid regurgitant velocity is 1.95 m/s, and  with an assumed right atrial pressure of 3 mmHg, the estimated right ventricular systolic pressure is 18.2 mmHg. Left Atrium: Left atrial size was normal in size. Right Atrium: Right atrial size was normal in size. Pericardium: There is no evidence of pericardial effusion. Mitral Valve: The mitral valve is grossly normal. Trivial mitral valve regurgitation. Tricuspid Valve: The tricuspid valve is grossly normal. Tricuspid valve regurgitation is trivial. Aortic Valve: The aortic valve is tricuspid. Aortic valve regurgitation is not visualized. Pulmonic Valve: The pulmonic valve was normal in structure. Pulmonic valve regurgitation is not visualized. Aorta: The aortic root and ascending aorta are structurally normal, with no evidence of dilitation. Venous: The inferior vena cava is normal in size with greater than 50% respiratory variability, suggesting right atrial pressure of 3 mmHg. IAS/Shunts: No atrial level shunt detected by color flow Doppler. Additional Comments: 3D was performed not requiring image post processing on an independent workstation and was normal.  LEFT VENTRICLE PLAX 2D LVIDd:         4.40 cm         Diastology LVIDs:  2.80 cm         LV e' medial:    10.73 cm/s LV PW:         0.70 cm         LV E/e' medial:  7.3 LV IVS:        0.70 cm         LV e' lateral:    17.20 cm/s LVOT diam:     2.10 cm         LV E/e' lateral: 4.6 LV SV:         51 LV SV Index:   30              2D Longitudinal LVOT Area:     3.46 cm        Strain                                2D Strain GLS   -27.7 %                                (A4C):                                2D Strain GLS   -27.1 %                                (A3C):                                2D Strain GLS   -29.1 %                                (A2C):                                2D Strain GLS   -28.0 %                                Avg:                                 3D Volume EF                                LV 3D EF:    Left                                             ventricul                                             ar  ejection                                             fraction                                             by 3D                                             volume is                                             60 %.                                 3D Volume EF:                                3D EF:        60 %                                LV EDV:       107 ml                                LV ESV:       43 ml                                LV SV:        64 ml RIGHT VENTRICLE             IVC RV Basal diam:  3.50 cm     IVC diam: 1.40 cm RV S prime:     12.80 cm/s TAPSE (M-mode): 2.2 cm LEFT ATRIUM             Index        RIGHT ATRIUM           Index LA diam:        3.40 cm 2.03 cm/m   RA Area:     12.20 cm LA Vol (A2C):   43.5 ml 25.99 ml/m  RA Volume:   30.30 ml  18.10 ml/m LA Vol (A4C):   24.6 ml 14.70 ml/m LA Biplane Vol: 34.9 ml 20.85 ml/m  AORTIC VALVE LVOT Vmax:   78.55 cm/s LVOT Vmean:  49.950 cm/s LVOT VTI:    0.147 m  AORTA Ao Root diam: 2.70 cm Ao Asc diam:  2.70 cm MITRAL VALVE               TRICUSPID VALVE MV Area (PHT): 4.36 cm    TR Peak grad:   15.2 mmHg MV Decel Time: 174 msec    TR Vmax:  195.00 cm/s MV E velocity: 78.50  cm/s MV A velocity: 51.60 cm/s  SHUNTS MV E/A ratio:  1.52        Systemic VTI:  0.15 m                            Systemic Diam: 2.10 cm Vinie Maxcy MD Electronically signed by Vinie Maxcy MD Signature Date/Time: 11/16/2024/3:58:51 PM    Final     Orders Placed This Encounter  Procedures   CBC with Differential (Cancer Center Only)    Standing Status:   Future    Expiration Date:   11/28/2025   Ferritin    Standing Status:   Future    Expiration Date:   11/28/2025   Iron  and Iron  Binding Capacity ([facility] only)    Standing Status:   Future    Expiration Date:   11/28/2025    Future Appointments  Date Time Provider Department Center  01/15/2025  9:00 AM Graham Krabbe, MD [facility] None  02/25/2025 11:00 AM [facility] None  02/25/2025 11:30 AM Quinterious Walraven, Chinita, MD [facility] None     I spent a total of 47 minutes during this encounter with the patient including review of chart and various tests results, discussions about plan of care and coordination of care plan.  This document was completed utilizing speech recognition software. Grammatical errors, random word insertions, pronoun errors, and incomplete sentences are an occasional consequence of this system due to software limitations, ambient noise, and hardware issues. Any formal questions or concerns about the content, text or information contained within the body of this dictation should be directly addressed to the provider for clarification.  "

## 2024-11-28 ENCOUNTER — Encounter: Payer: Self-pay | Admitting: Oncology

## 2024-11-28 NOTE — Assessment & Plan Note (Signed)
 She was referred to cardiology for evaluation of palpitations.  On 09/22/2024, labs at cardiologist office showed hemoglobin of 11.1, MCV 79, white count 3400, platelet count 297,000.  Iron  studies showed evidence of iron  deficiency with ferritin of 7, iron  saturation of 10%.  Referral was sent to us  for further evaluation and management of iron  deficiency anemia.  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 She reports persistent fatigue, palpitations, and worsening exertional dyspnea. Hemoglobin and leukocyte count are now within normal limits; iron  saturation is at the lower limit of normal, and ferritin is low at 12. She has intolerance to prescription oral iron  but better tolerance of over-the-counter liquid iron .  Hemoglobin today is better at 12.2.  MCV remains low at 76.2.  Ferritin today is 12, decreased.  Iron  binding capacity is increased at 539, indicating persistent iron  deficiency state.  Given her symptoms from iron  deficiency, we will proceed with IV iron  infusion with either Feraheme or Venofer , pending authorization.  Will arrange infusions at infusion center on [address]. - Reviewed recent CBC and iron  studies; hemoglobin and white count normal, iron  saturation at lower limit of normal, ferritin pending.  - Educated her regarding the association between iron  deficiency and symptoms including fatigue, pica, and dyspnea.  - She was also recommended a trial of over-the-counter oral iron  alternatives (e.g., MegaFood Blood Builder, Slow FE) with vitamin C to enhance absorption, as these are better tolerated than prescription iron .  RTC in 3 months for follow-up with repeat labs.

## 2024-11-30 ENCOUNTER — Telehealth: Payer: Self-pay | Admitting: Pharmacy Technician

## 2024-11-30 ENCOUNTER — Other Ambulatory Visit: Payer: Self-pay | Admitting: Oncology

## 2024-11-30 NOTE — Telephone Encounter (Signed)
 Auth Submission: NO AUTH NEEDED Site of care: Site of care: [facility] Payer: [facility] Medication & CPT/J Code(s) submitted: Feraheme (ferumoxytol) [id number] Diagnosis Code: D50.9 Route of submission (phone, fax, portal):  Phone # Fax # Auth type: Buy/Bill PB Units/visits requested: X2 DOSES Reference number:  Approval from: 11/30/24 to 03/04/25

## 2024-12-06 ENCOUNTER — Encounter: Payer: Self-pay | Admitting: Oncology

## 2024-12-08 ENCOUNTER — Ambulatory Visit (INDEPENDENT_AMBULATORY_CARE_PROVIDER_SITE_OTHER): Admitting: *Deleted

## 2024-12-08 VITALS — BP 97/70 | HR 85 | Temp 98.5°F | Resp 16 | Ht 65.0 in | Wt 139.2 lb

## 2024-12-08 DIAGNOSIS — D500 Iron deficiency anemia secondary to blood loss (chronic): Secondary | ICD-10-CM | POA: Diagnosis not present

## 2024-12-08 MED ORDER — SODIUM CHLORIDE 0.9 % IV SOLN
510.0000 mg | Freq: Once | INTRAVENOUS | Status: AC
  Administered 2024-12-08: 510 mg via INTRAVENOUS
  Filled 2024-12-08: qty 17

## 2024-12-08 MED ORDER — ACETAMINOPHEN 325 MG PO TABS
650.0000 mg | ORAL_TABLET | Freq: Once | ORAL | Status: AC
  Administered 2024-12-08: 650 mg via ORAL
  Filled 2024-12-08: qty 2

## 2024-12-08 MED ORDER — DIPHENHYDRAMINE HCL 25 MG PO CAPS
25.0000 mg | ORAL_CAPSULE | Freq: Once | ORAL | Status: AC
  Administered 2024-12-08: 25 mg via ORAL
  Filled 2024-12-08: qty 1

## 2024-12-08 NOTE — Patient Instructions (Signed)
 Ferumoxytol  Injection What is this medication? FERUMOXYTOL  (FER ue MOX i tol) treats low levels of iron  in your body (iron  deficiency anemia). Iron  is a mineral that plays an important role in making red blood cells, which carry oxygen from your lungs to the rest of your body. This medicine may be used for other purposes; ask your health care provider or pharmacist if you have questions. COMMON BRAND NAME(S): Feraheme  What should I tell my care team before I take this medication? They need to know if you have any of these conditions: Anemia not caused by low iron  levels High levels of iron  in the blood Magnetic resonance imaging (MRI) test scheduled An unusual or allergic reaction to iron , other medications, foods, dyes, or preservatives Pregnant or trying to get pregnant Breastfeeding How should I use this medication? This medication is injected into a vein. It is given by your care team in a hospital or clinic setting. Talk to your care team the use of this medication in children. Special care may be needed. Overdosage: If you think you have taken too much of this medicine contact a poison control center or emergency room at once. NOTE: This medicine is only for you. Do not share this medicine with others. What if I miss a dose? It is important not to miss your dose. Call your care team if you are unable to keep an appointment. What may interact with this medication? Other iron  products This list may not describe all possible interactions. Give your health care provider a list of all the medicines, herbs, non-prescription drugs, or dietary supplements you use. Also tell them if you smoke, drink alcohol , or use illegal drugs. Some items may interact with your medicine. What should I watch for while using this medication? Your condition will be monitored carefully while you are receiving this medication. Tell your care team if your symptoms do not start to get better or if they get  worse. You may need blood work done while you are taking this medication. Sometimes, when medications are infused into veins, a little can leak out of the vein and into the tissue around it. If this medication leaks, it can cause a brown or dark stain on the skin. This is not common. It may be permanent. If you feel pain or swelling during your infusion, tell your care team right away. They can stop the infusion and treat the area. You may need to eat more foods that contain iron . Talk to your care team. Foods that contain iron  include whole grains or cereals, dried fruits, beans, peas, leafy green vegetables, and organ meats (liver, kidney). What side effects may I notice from receiving this medication? Side effects that you should report to your care team as soon as possible: Allergic reactions--skin rash, itching, hives, swelling of the face, lips, tongue, or throat Low blood pressure--dizziness, feeling faint or lightheaded, blurry vision Painful swelling, warmth, or redness of the skin, brown or dark skin color at the infusion site Shortness of breath Side effects that usually do not require medical attention (report these to your care team if they continue or are bothersome): Flushing Headache Joint pain Muscle pain Nausea This list may not describe all possible side effects. Call your doctor for medical advice about side effects. You may report side effects to FDA at 1-800-FDA-1088. Where should I keep my medication? This medication is given in a hospital or clinic. It will not be stored at home. NOTE: This sheet is a summary.  It may not cover all possible information. If you have questions about this medicine, talk to your doctor, pharmacist, or health care provider.  2025 [employer]/Gold Standard (2024-09-09 00:00:00)

## 2024-12-15 ENCOUNTER — Ambulatory Visit

## 2025-01-15 ENCOUNTER — Ambulatory Visit (HOSPITAL_COMMUNITY): Admitting: Psychiatry

## 2025-02-25 ENCOUNTER — Inpatient Hospital Stay

## 2025-02-25 ENCOUNTER — Inpatient Hospital Stay: Admitting: Oncology
